# Patient Record
Sex: Female | Born: 1985 | Race: Black or African American | Hispanic: Yes | Marital: Single | Smoking: Never smoker
Health system: Southern US, Community
[De-identification: ages and names within clinical notes are randomized; demographics above are authoritative.]

## PROBLEM LIST (undated history)

## (undated) DIAGNOSIS — D649 Anemia, unspecified: Secondary | ICD-10-CM

## (undated) DIAGNOSIS — K5190 Ulcerative colitis, unspecified, without complications: Secondary | ICD-10-CM

## (undated) DIAGNOSIS — R7303 Prediabetes: Secondary | ICD-10-CM

## (undated) HISTORY — DX: Ulcerative colitis, unspecified, without complications: K51.90

## (undated) HISTORY — DX: Prediabetes: R73.03

## (undated) HISTORY — PX: OTHER SURGICAL HISTORY: SHX169

## (undated) HISTORY — PX: COLONOSCOPY: SHX174

## (undated) HISTORY — PX: NO PAST SURGERIES: SHX2092

---
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
## 2007-02-27 ENCOUNTER — Inpatient Hospital Stay (HOSPITAL_COMMUNITY): Admission: AD | Admit: 2007-02-27 | Discharge: 2007-02-27 | Payer: Self-pay | Admitting: Obstetrics & Gynecology

## 2007-03-18 ENCOUNTER — Inpatient Hospital Stay (HOSPITAL_COMMUNITY): Admission: AD | Admit: 2007-03-18 | Discharge: 2007-03-18 | Payer: Self-pay | Admitting: Family Medicine

## 2007-03-22 ENCOUNTER — Inpatient Hospital Stay (HOSPITAL_COMMUNITY): Admission: AD | Admit: 2007-03-22 | Discharge: 2007-03-22 | Payer: Self-pay | Admitting: Obstetrics & Gynecology

## 2007-04-14 ENCOUNTER — Inpatient Hospital Stay (HOSPITAL_COMMUNITY): Admission: AD | Admit: 2007-04-14 | Discharge: 2007-04-15 | Payer: Self-pay | Admitting: Obstetrics & Gynecology

## 2007-05-03 ENCOUNTER — Inpatient Hospital Stay (HOSPITAL_COMMUNITY): Admission: AD | Admit: 2007-05-03 | Discharge: 2007-05-03 | Payer: Self-pay | Admitting: Obstetrics & Gynecology

## 2007-06-23 ENCOUNTER — Emergency Department (HOSPITAL_COMMUNITY): Admission: EM | Admit: 2007-06-23 | Discharge: 2007-06-23 | Payer: Self-pay | Admitting: Family Medicine

## 2007-07-10 ENCOUNTER — Inpatient Hospital Stay (HOSPITAL_COMMUNITY): Admission: AD | Admit: 2007-07-10 | Discharge: 2007-07-10 | Payer: Self-pay | Admitting: Obstetrics and Gynecology

## 2007-09-20 ENCOUNTER — Inpatient Hospital Stay (HOSPITAL_COMMUNITY): Admission: AD | Admit: 2007-09-20 | Discharge: 2007-09-20 | Payer: Self-pay | Admitting: Family Medicine

## 2007-09-21 ENCOUNTER — Inpatient Hospital Stay (HOSPITAL_COMMUNITY): Admission: AD | Admit: 2007-09-21 | Discharge: 2007-09-23 | Payer: Self-pay | Admitting: Obstetrics and Gynecology

## 2007-09-21 ENCOUNTER — Encounter (INDEPENDENT_AMBULATORY_CARE_PROVIDER_SITE_OTHER): Payer: Self-pay

## 2007-11-30 ENCOUNTER — Emergency Department (HOSPITAL_COMMUNITY): Admission: EM | Admit: 2007-11-30 | Discharge: 2007-11-30 | Payer: Self-pay | Admitting: Emergency Medicine

## 2008-12-09 ENCOUNTER — Emergency Department (HOSPITAL_COMMUNITY): Admission: EM | Admit: 2008-12-09 | Discharge: 2008-12-09 | Payer: Self-pay | Admitting: Family Medicine

## 2009-05-21 ENCOUNTER — Emergency Department (HOSPITAL_COMMUNITY): Admission: EM | Admit: 2009-05-21 | Discharge: 2009-05-21 | Payer: Self-pay | Admitting: Family Medicine

## 2009-07-19 ENCOUNTER — Inpatient Hospital Stay (HOSPITAL_COMMUNITY): Admission: AD | Admit: 2009-07-19 | Discharge: 2009-07-19 | Payer: Self-pay | Admitting: Obstetrics & Gynecology

## 2009-07-19 ENCOUNTER — Ambulatory Visit: Payer: Self-pay | Admitting: Nurse Practitioner

## 2010-01-27 ENCOUNTER — Emergency Department (HOSPITAL_COMMUNITY)
Admission: EM | Admit: 2010-01-27 | Discharge: 2010-01-27 | Payer: Self-pay | Source: Home / Self Care | Admitting: Family Medicine

## 2010-04-18 LAB — URINE CULTURE
Colony Count: NO GROWTH
Culture: NO GROWTH

## 2010-04-18 LAB — CBC
HCT: 42.2 % (ref 36.0–46.0)
Hemoglobin: 14.2 g/dL (ref 12.0–15.0)
MCHC: 33.7 g/dL (ref 30.0–36.0)
MCV: 79.4 fL (ref 78.0–100.0)
Platelets: 354 10*3/uL (ref 150–400)
RBC: 5.31 MIL/uL — ABNORMAL HIGH (ref 3.87–5.11)
RDW: 14.5 % (ref 11.5–15.5)
WBC: 11.6 10*3/uL — ABNORMAL HIGH (ref 4.0–10.5)

## 2010-04-18 LAB — GC/CHLAMYDIA PROBE AMP, GENITAL
Chlamydia, DNA Probe: NEGATIVE
GC Probe Amp, Genital: NEGATIVE

## 2010-04-18 LAB — URINALYSIS, ROUTINE W REFLEX MICROSCOPIC
Bilirubin Urine: NEGATIVE
Glucose, UA: NEGATIVE mg/dL
Ketones, ur: NEGATIVE mg/dL
Leukocytes, UA: NEGATIVE
Nitrite: NEGATIVE
Protein, ur: NEGATIVE mg/dL
Specific Gravity, Urine: >1.03 — ABNORMAL HIGH (ref 1.005–1.030)
Urobilinogen, UA: 2 mg/dL — ABNORMAL HIGH (ref 0.0–1.0)
pH: 6 (ref 5.0–8.0)

## 2010-04-18 LAB — URINE MICROSCOPIC-ADD ON

## 2010-04-18 LAB — WET PREP, GENITAL
Trich, Wet Prep: NONE SEEN
Yeast Wet Prep HPF POC: NONE SEEN

## 2010-04-18 LAB — POCT PREGNANCY, URINE: Preg Test, Ur: NEGATIVE

## 2010-06-15 NOTE — H&P (Signed)
[name], [name]                ACCOUNT NO.:  [id number]   MEDICAL RECORD NO.:  [id number]          PATIENT TYPE:  INP   LOCATION:  9171                          FACILITY:  [facility]   PHYSICIAN:  Amanda DennisDATE OF BIRTH:  [birth year]   DATE OF ADMISSION:  09/21/2007  DATE OF DISCHARGE:  09/20/2007                              HISTORY & PHYSICAL   This is a 25 year old gravida 3, para 0-0-2-0 at 38-5/7 weeks who  presents with complaints of worsening contractions.  She was sent home  this morning with an unchanged cervix that was closed.  She reports  positive fetal movement and denies leaking or bleeding.  Pregnancy has  been followed by the nurse midwife.  Service remarkable for,  1. Late care.  2. Unsure LMP.  3. Anemia.  4. HSV II.  5. Group B strep negative.   ALLERGIES:  Negative.   OB history is remarkable for induced abortion in 2005 and a spontaneous  abortion in 2008 with a D&C.   Medical history is remarkable for,  1. Chlamydia in 2007.  2. Childhood varicella.  3. History of asthma, which requires no current treatment.  4. History of anemia.   Surgical history is remarkable for EAB and D&C.   Family history is remarkable for father with hypertension.  Grandmother  and grandfather with diabetes.  Grandmother with Alzheimer's and father  with depression.   Genetic history is negative.   SOCIAL HISTORY:  The patient is single.  Father of baby, [name],  who is involved and supportive.  She does not report a religious  affiliation.  She denies any alcohol, tobacco, or drug use.   PRENATAL LABORATORY:  Hemoglobin 10.3 and platelets 360.  Blood type A  positive.  Antibody screen negative.  Sickle cell negative.  RPR  nonreactive.  Rubella immune.  Hepatitis negative.  HIV negative.  Gonorrhea negative.  Chlamydia negative.   HISTORY OF CURRENT PREGNANCY:  The patient entered care at 30 weeks'  gestation.  She had an ultrasound that was normal.   She was treated for  BV at 21 weeks and was started on Ensure and iron for nutritional  deficits.  She had glucola that was normal, and she was diagnosed with  herpes type II at 31 weeks when she developed a lesion, and she was  started on Valtrex at 34 weeks and her group B strep was negative as  were gonorrhea and chlamydia cultures at term.   OBJECTIVE:  VITAL SIGNS:  Stable and afebrile.  HEENT:  Within normal limits.  THYROID:  Normal and not enlarged.  CHEST:  Clear to auscultation.  HEART:  Regular rate and rhythm.  ABDOMEN:  Gravid.  Vertex, Leopold exam shows reactive fetal heart rate  with contractions every 1-1/2 to 3 minutes.  CERVIX:  This morning was closed in 80% and is now 2 cm, a 100% effaced  in 0 station with a vertex presentation.  There are no HSV lesions  notable.  EXTREMITIES:  Within normal limits.   ASSESSMENT:  1. Intrauterine pregnancy at 38-5/7 weeks.  2. Labor.   PLAN:  1. Admit to birthing suites per Dr. Pennie Rushing.  2. Routine CNM orders.  3. The patient desires epidural for analgesia.      [name], C.N.M.      ______________________________  [name], M.D.    MLW/MEDQ  D:  09/21/2007  T:  09/21/2007  Job:  [id number]

## 2010-06-15 NOTE — Consult Note (Signed)
NAMEMarland Kitchen  [name], [name]                ACCOUNT NO.:  [id number]   MEDICAL RECORD NO.:  [id number]          PATIENT TYPE:  WOC   LOCATION:  WOC                          FACILITY:  [facility]   PHYSICIAN:  Lesly Dukes, M.D. DATE OF BIRTH:  1985-06-24   DATE OF CONSULTATION:  06/09/2006  DATE OF DISCHARGE:                                 CONSULTATION   HISTORY:  This is a 25 year old, G2, P0-0-1-0, who had a first trimester  elective AB in the past who presented to [facility]U on May 3 due to having  lower abdominal and pelvic pain. She was confirmed to be [redacted] weeks  pregnant by ultrasound which showed an intrauterine embryo with no fetal  heart rate which was consistent with fetal demise. She has had no  further abdominal pain. Denies cramping or any bleeding or any passage  per vagina. She has no other complaints.   ALLERGIES:  None.   MEDICATIONS:  None.   MENSTRUAL HISTORY:  11 x 28 x 3. She had used Depo in the past.   OBSTETRICAL HISTORY:  As above.   GYNECOLOGICAL HISTORY:  Negative Paps in the past. No STIs.   SURGERIES:  None.   FAMILY HISTORY:  Significant for diabetes in a grandparent.   PERSONAL MEDICAL HISTORY:  Negative.   SOCIAL HISTORY:  She works. Nonsmoker. No alcohol or drugs. Denies  abuse.   SYSTEMIC REVIEW:  Essentially negative except for some slight vaginal  itching.   PHYSICAL EXAMINATION:  Blood pressure 107/65, pulse 85, weight 59.9,  height 4 feet 11 inches.  THYROID:  MP.  LUNGS:  Clear to auscultation bilaterally.  HEART:  RRR without murmur done by [employer] student.  EXTREMITIES:  No edema. Reflexes normal.  ABDOMEN:  Soft, nontender. Fundus slightly above the symphysis. Bimanual  exam confirms a long closed cervix and about 8-week size enlarged  nontender uterus.   LABORATORY DATA:  The patient is A positive. Her hemoglobin is 13 on the  3rd. She had a negative urinalysis on the 3rd as well as a negative wet  prep.   ASSESSMENT:  Nine-week missed  abortion.   PLAN:  Consulted Dr. Okey Dupre who is setting up a D&C which is explained to  patient which will be scheduled as soon as possible.      Deirdre Christy Gentles, C.N.M.    ______________________________  Lesly Dukes, M.D.    DP/MEDQ  D:  06/09/2006  T:  06/09/2006  Job:  [id number]

## 2010-06-15 NOTE — Op Note (Signed)
[name], [name]                ACCOUNT NO.:  [id number]   MEDICAL RECORD NO.:  [id number]          PATIENT TYPE:  AMB   LOCATION:  SDC                           [facility]ILITY:  [facility]   PHYSICIAN:  Phil D. Okey Dupre, M.D.     DATE OF BIRTH:  1985/07/15   DATE OF PROCEDURE:  06/12/2006  DATE OF DISCHARGE:                               OPERATIVE REPORT   PROCEDURE:  Dilatation and evacuation.   PREOPERATIVE DIAGNOSIS:  Missed abortion.   POSTOPERATIVE DIAGNOSIS:  Missed abortion.   SURGEON:  Dr. Okey Dupre.   ESTIMATED BLOOD LOSS:  Minimal.   PRODUCTS OF CONCEPTION:  To pathology.   POSTOPERATIVE CONDITION:  Satisfactory.   ANESTHESIA:  Was MAC plus local.   PROCEDURE WENT AS FOLLOWS:  Under satisfactory MAC analgesia with the  patient in dorsal lithotomy position, perineum and vagina prepped in the  usual sterile manner.  Bimanual pelvic examination revealed the uterus  and first degree retroversion freely movable.  Adnexa could not be well  outlined.  A Graves open speculum was placed the vagina so the cervix  could be seen in the center of the viewing area.  Anterior lip of the  cervix grasped with a single-tooth tenaculum.  Paracervical block was  used, using 10 mL of 1% Xylocaine in each of the lateral cervical areas  at 4 and 8 o'clock.  The uterine cavity was then sounded up to a depth  of 9 cm.  Cervical os dilated to #8 Hagar dilator and  a #8 curved  suction curette was used to evacuate the uterine contents without  incident.  Tenaculum and speculum removed from vagina.  The area was  observed for bleeding, and none was noted.  The patient transferred to  the recovery room in satisfactory condition, having tolerated the  procedure well.           ______________________________  Javier Glazier. Okey Dupre, M.D.     PDR/MEDQ  D:  06/12/2006  T:  06/12/2006  Job:  [id number]

## 2010-10-21 LAB — URINALYSIS, ROUTINE W REFLEX MICROSCOPIC
Glucose, UA: NEGATIVE
Ketones, ur: >80 — AB
Leukocytes, UA: NEGATIVE
Nitrite: NEGATIVE
Protein, ur: 30 — AB
Specific Gravity, Urine: >1.03 — ABNORMAL HIGH
Urobilinogen, UA: 1
pH: 6

## 2010-10-21 LAB — POCT PREGNANCY, URINE
Operator id: 280671
Preg Test, Ur: POSITIVE

## 2010-10-21 LAB — URINE MICROSCOPIC-ADD ON

## 2010-10-21 LAB — CBC
HCT: 37.6
Hemoglobin: 12.9
MCHC: 34.4
MCV: 78.6
Platelets: 336
RBC: 4.78
RDW: 13.6
WBC: 10.6 — ABNORMAL HIGH

## 2010-10-21 LAB — WET PREP, GENITAL
Trich, Wet Prep: NONE SEEN
Yeast Wet Prep HPF POC: NONE SEEN

## 2010-10-21 LAB — GC/CHLAMYDIA PROBE AMP, GENITAL
Chlamydia, DNA Probe: NEGATIVE
GC Probe Amp, Genital: NEGATIVE

## 2010-10-22 LAB — COMPREHENSIVE METABOLIC PANEL
ALT: 10
AST: 15
Albumin: 2.2 — ABNORMAL LOW
Alkaline Phosphatase: 47
BUN: 4 — ABNORMAL LOW
CO2: 23
Calcium: 8.5
Chloride: 104
Creatinine, Ser: 0.61
GFR calc Af Amer: >60
GFR calc non Af Amer: >60
Glucose, Bld: 85
Potassium: 3.5
Sodium: 133 — ABNORMAL LOW
Total Bilirubin: 0.3
Total Protein: 5.7 — ABNORMAL LOW

## 2010-10-22 LAB — URINE MICROSCOPIC-ADD ON

## 2010-10-22 LAB — URINALYSIS, ROUTINE W REFLEX MICROSCOPIC
Glucose, UA: NEGATIVE
Ketones, ur: 15 — AB
Leukocytes, UA: NEGATIVE
Nitrite: NEGATIVE
Protein, ur: NEGATIVE
Specific Gravity, Urine: >1.03 — ABNORMAL HIGH
Urobilinogen, UA: 0.2
pH: 6

## 2010-10-22 LAB — WET PREP, GENITAL
Trich, Wet Prep: NONE SEEN
Yeast Wet Prep HPF POC: NONE SEEN

## 2010-10-22 LAB — CBC
HCT: 32.4 — ABNORMAL LOW
Hemoglobin: 11.3 — ABNORMAL LOW
MCHC: 34.8
MCV: 78.9
Platelets: 296
RBC: 4.1
RDW: 14.4
WBC: 6.7

## 2010-10-25 LAB — URINALYSIS, ROUTINE W REFLEX MICROSCOPIC
Glucose, UA: NEGATIVE
Hgb urine dipstick: NEGATIVE
Ketones, ur: 40 — AB
Nitrite: NEGATIVE
Protein, ur: NEGATIVE
Specific Gravity, Urine: >1.03 — ABNORMAL HIGH
Urobilinogen, UA: 0.2
pH: 6

## 2010-10-25 LAB — CBC
HCT: 32.5 — ABNORMAL LOW
Hemoglobin: 11.2 — ABNORMAL LOW
MCHC: 34.5
MCV: 78.7
Platelets: 351
RBC: 4.13
RDW: 13.9
WBC: 11.7 — ABNORMAL HIGH

## 2010-10-25 LAB — COMPREHENSIVE METABOLIC PANEL
ALT: 8
AST: 14
Albumin: 2.1 — ABNORMAL LOW
Alkaline Phosphatase: 61
BUN: 4 — ABNORMAL LOW
CO2: 24
Calcium: 8.4
Chloride: 105
Creatinine, Ser: 0.37 — ABNORMAL LOW
GFR calc Af Amer: >60
GFR calc non Af Amer: >60
Glucose, Bld: 92
Potassium: 3.4 — ABNORMAL LOW
Sodium: 134 — ABNORMAL LOW
Total Bilirubin: 0.5
Total Protein: 6.4

## 2010-10-26 LAB — WET PREP, GENITAL
Clue Cells Wet Prep HPF POC: NONE SEEN
Trich, Wet Prep: NONE SEEN

## 2010-10-26 LAB — URINALYSIS, ROUTINE W REFLEX MICROSCOPIC
Bilirubin Urine: NEGATIVE
Glucose, UA: NEGATIVE
Hgb urine dipstick: NEGATIVE
Ketones, ur: NEGATIVE
Nitrite: NEGATIVE
Protein, ur: NEGATIVE
Specific Gravity, Urine: 1.01
Urobilinogen, UA: 0.2
pH: 6.5

## 2010-10-26 LAB — URINE CULTURE: Colony Count: 35000

## 2010-10-26 LAB — GC/CHLAMYDIA PROBE AMP, GENITAL
Chlamydia, DNA Probe: NEGATIVE
GC Probe Amp, Genital: NEGATIVE

## 2010-10-26 LAB — URINE MICROSCOPIC-ADD ON

## 2011-03-21 ENCOUNTER — Emergency Department (HOSPITAL_COMMUNITY)
Admission: EM | Admit: 2011-03-21 | Discharge: 2011-03-21 | Disposition: A | Discharge status: Home / Self Care | Payer: Self-pay | Source: Home / Self Care

## 2011-07-11 ENCOUNTER — Encounter (HOSPITAL_COMMUNITY): Payer: Self-pay

## 2011-07-11 ENCOUNTER — Emergency Department (INDEPENDENT_AMBULATORY_CARE_PROVIDER_SITE_OTHER)
Admission: EM | Admit: 2011-07-11 | Discharge: 2011-07-11 | Disposition: A | Discharge status: Home / Self Care | Payer: Self-pay | Source: Home / Self Care | Attending: Emergency Medicine | Admitting: Emergency Medicine

## 2011-07-11 DIAGNOSIS — J020 Streptococcal pharyngitis: Secondary | ICD-10-CM

## 2011-07-11 LAB — POCT RAPID STREP A: Streptococcus, Group A Screen (Direct): POSITIVE — AB

## 2011-07-11 MED ORDER — PENICILLIN V POTASSIUM 500 MG PO TABS: 500.0000 mg | ORAL_TABLET | Freq: Four times a day (QID) | ORAL | Status: AC

## 2011-07-11 MED ORDER — HYDROCODONE-ACETAMINOPHEN 5-325 MG PO TABS: 2.0000 | ORAL_TABLET | ORAL | Status: AC | PRN

## 2011-07-11 MED ORDER — ACETAMINOPHEN 500 MG PO TABS
1000.0000 mg | ORAL_TABLET | Freq: Once | ORAL | Status: AC
  Administered 2011-07-11: 1000 mg via ORAL

## 2011-07-11 MED ORDER — ACETAMINOPHEN 325 MG PO TABS
ORAL_TABLET | ORAL | Status: AC
  Filled 2011-07-11: qty 3

## 2011-07-11 NOTE — ED Provider Notes (Signed)
History     CSN: [id number]  Arrival date & time 07/11/11  1258   None     Chief Complaint  Patient presents with  . Sore Throat    (Consider location/radiation/quality/duration/timing/severity/associated sxs/prior treatment) Patient is a 26 y.o. female presenting with pharyngitis. The history is provided by the patient. No language interpreter was used.  Sore Throat This is a new problem. The current episode started more than 2 days ago. The problem occurs constantly. The problem has been gradually worsening. The symptoms are aggravated by nothing. The symptoms are relieved by nothing. She has tried nothing for the symptoms.  Pt complains of a sorethroat and fever.  History reviewed. No pertinent past medical history.  History reviewed. No pertinent past surgical history.  History reviewed. No pertinent family history.  History  Substance Use Topics  . Smoking status: Not on file  . Smokeless tobacco: Not on file  . Alcohol Use: Not on file    OB History    Grav Para Term Preterm Abortions TAB SAB Ect Mult Living                  Review of Systems  HENT: Positive for sore throat.   All other systems reviewed and are negative.    Allergies  Review of patient's allergies indicates no known allergies.  Home Medications  No current outpatient prescriptions on file.  BP 116/63  Pulse 126  Temp(Src) 102.5 F (39.2 C) (Oral)  Resp 20  SpO2 100%  Physical Exam  Nursing note and vitals reviewed. Constitutional: She is oriented to person, place, and time. She appears well-developed and well-nourished.  HENT:  Head: Normocephalic and atraumatic.  Right Ear: External ear normal.  Left Ear: External ear normal.  Nose: Nose normal.  Mouth/Throat: Oropharyngeal exudate present.  Eyes: Conjunctivae and EOM are normal. Pupils are equal, round, and reactive to light.  Neck: Normal range of motion. Neck supple.  Cardiovascular: Normal rate, regular rhythm and normal  heart sounds.   Pulmonary/Chest: Effort normal and breath sounds normal.  Abdominal: Soft.  Musculoskeletal: Normal range of motion.  Neurological: She is alert and oriented to person, place, and time. She has normal reflexes.  Skin: Skin is warm.  Psychiatric: She has a normal mood and affect.    ED Course  Procedures (including critical care time)  Labs Reviewed  POCT RAPID STREP A ([facility] ONLY) - Abnormal; Notable for the following:    Streptococcus, Group A Screen (Direct) POSITIVE (*)    All other components within normal limits   No results found.   1. Strep pharyngitis       MDM  Strep positive,  rx for pcn hydrocodone for pain        Lonia Skinner [place], [place] 07/11/11 1504  Lonia Skinner [place], [place] 07/11/11 1635  Lonia Skinner [place], [place] 07/11/11 1635  Lonia Skinner [place], [place] 07/11/11 1637

## 2011-07-11 NOTE — ED Notes (Signed)
C/o ST for past few days, body aches, fever; posterior nasopharynx reddened, tonsils swollen,exudative

## 2011-07-11 NOTE — Discharge Instructions (Signed)

## 2011-07-12 NOTE — ED Provider Notes (Signed)
Medical screening examination/treatment/procedure(s) were performed by non-physician practitioner and as supervising physician I was immediately available for consultation/collaboration.  Leslee Home, M.D.   Reuben Likes, MD 07/12/11 1059

## 2012-08-13 ENCOUNTER — Emergency Department (INDEPENDENT_AMBULATORY_CARE_PROVIDER_SITE_OTHER)
Admission: EM | Admit: 2012-08-13 | Discharge: 2012-08-13 | Disposition: A | Discharge status: Home / Self Care | Payer: Medicaid Other | Source: Home / Self Care

## 2012-08-13 DIAGNOSIS — Z2089 Contact with and (suspected) exposure to other communicable diseases: Secondary | ICD-10-CM

## 2012-08-13 DIAGNOSIS — Z202 Contact with and (suspected) exposure to infections with a predominantly sexual mode of transmission: Secondary | ICD-10-CM

## 2012-08-13 LAB — POCT URINALYSIS DIP (DEVICE)
Bilirubin Urine: NEGATIVE
Glucose, UA: NEGATIVE mg/dL
Ketones, ur: NEGATIVE mg/dL
Leukocytes, UA: NEGATIVE
Nitrite: NEGATIVE
Protein, ur: NEGATIVE mg/dL
Specific Gravity, Urine: >=1.03 (ref 1.005–1.030)
Urobilinogen, UA: >=8 mg/dL (ref 0.0–1.0)
pH: 6.5 (ref 5.0–8.0)

## 2012-08-13 LAB — POCT PREGNANCY, URINE: Preg Test, Ur: NEGATIVE

## 2012-08-13 NOTE — ED Notes (Signed)
Patient states her partner was told he has chylamdia.   Patient came here to be check.

## 2012-08-13 NOTE — ED Provider Notes (Addendum)
[name] is a 27 y.o. female who presents to Urgent Care today for STD screening. Patient's partner was recently diagnosed with Chlamydia. She's here to be tested. She is currently asymptomatic with vaginal discharge abdominal or pelvic pain or dysuria. She feels well otherwise with no fevers or chills. She has never had a sexually transmitted infections.    PMH reviewed. Healthy otherwise History  Substance Use Topics  . Smoking status: Not on file  . Smokeless tobacco: Not on file  . Alcohol Use: Not on file   ROS as above Medications reviewed. No current facility-administered medications for this encounter.   No current outpatient prescriptions on file.    Exam:  BP 119/75  Pulse 82  Temp(Src) 98.2 F (36.8 C) (Oral)  Resp 16  SpO2 98% Gen: Well NAD Left arm: Implanon contraception device in place  Results for orders placed during the hospital encounter of 08/13/12 (from the past 24 hour(s))  POCT URINALYSIS DIP (DEVICE)     Status: Abnormal   Collection Time    08/13/12  6:10 PM      Result Value Range   Glucose, UA NEGATIVE  NEGATIVE mg/dL   Bilirubin Urine NEGATIVE  NEGATIVE   Ketones, ur NEGATIVE  NEGATIVE mg/dL   Specific Gravity, Urine >=1.030  1.005 - 1.030   Hgb urine dipstick SMALL (*) NEGATIVE   pH 6.5  5.0 - 8.0   Protein, ur NEGATIVE  NEGATIVE mg/dL   Urobilinogen, UA >=1.6  0.0 - 1.0 mg/dL   Nitrite NEGATIVE  NEGATIVE   Leukocytes, UA NEGATIVE  NEGATIVE  POCT PREGNANCY, URINE     Status: None   Collection Time    08/13/12  6:17 PM      Result Value Range   Preg Test, Ur NEGATIVE  NEGATIVE   No results found.  Assessment and Plan: 27 y.o. female with STD exposure.  Plan: Urine collection for Chlamydia and gonorrhea. HIV antibody and RPR.  Will call patient with result.  Phone #[phone]    Rodolph Bong, MD 08/13/12 1819  Rodolph Bong, MD 08/13/12 [phone]

## 2012-08-14 LAB — HIV ANTIBODY (ROUTINE TESTING W REFLEX): HIV: NONREACTIVE

## 2012-08-14 LAB — RPR: RPR Ser Ql: NONREACTIVE

## 2012-08-15 NOTE — ED Notes (Signed)
Chart review.

## 2012-08-17 ENCOUNTER — Emergency Department (INDEPENDENT_AMBULATORY_CARE_PROVIDER_SITE_OTHER)
Admission: EM | Admit: 2012-08-17 | Discharge: 2012-08-17 | Disposition: A | Discharge status: Home / Self Care | Payer: Medicaid Other | Source: Home / Self Care

## 2012-08-17 ENCOUNTER — Encounter (HOSPITAL_COMMUNITY): Payer: Self-pay | Admitting: *Deleted

## 2012-08-17 ENCOUNTER — Other Ambulatory Visit (HOSPITAL_COMMUNITY)
Admission: RE | Admit: 2012-08-17 | Discharge: 2012-08-17 | Disposition: A | Discharge status: Home / Self Care | Payer: Medicaid Other | Source: Ambulatory Visit | Attending: Family Medicine | Admitting: Family Medicine

## 2012-08-17 DIAGNOSIS — Z202 Contact with and (suspected) exposure to infections with a predominantly sexual mode of transmission: Secondary | ICD-10-CM

## 2012-08-17 DIAGNOSIS — N760 Acute vaginitis: Secondary | ICD-10-CM | POA: Insufficient documentation

## 2012-08-17 DIAGNOSIS — Z113 Encounter for screening for infections with a predominantly sexual mode of transmission: Secondary | ICD-10-CM | POA: Insufficient documentation

## 2012-08-17 NOTE — ED Notes (Signed)
Urine sample obtained and pt. told again that Dr. Denyse Amass or myself would call her the results. Pt. voiced understanding.

## 2012-08-17 NOTE — ED Notes (Addendum)
Pt. here to get lab result. Pt. given HIV and RPR results. Pt. said she came to be tested for GC/Chlamydia. I told her that, they were not ordered.  Dr. Denyse Amass talked to pt. and explained what happened. He asked her to get a urine sample and he would call her the results on Sunday.  Pt. agreed to this plan. VS deferred for this visit- lab only.

## 2012-08-17 NOTE — ED Notes (Signed)
Pt. came to [facility] for her lab results. Pt. verified x 2 and told that HIV and RPR were non-reactive. Pt. said she came for the GC/Chlamydia.  The tests were not ordered by mistake. Discussed with Dr. Denyse Amass. He talked to pt. asked her if she can give [place] a urine sample now and we can send it to be checked. He said he will call her Sunday or I will call her on Monday with results. He told her it would be a no charge.  Pt. agreed with the plan.

## 2012-08-20 NOTE — ED Notes (Signed)
Patient called to get her lab reports that are not yet ready. After verifying ID, discussed no lab reports at this time

## 2012-09-19 ENCOUNTER — Encounter (HOSPITAL_COMMUNITY): Payer: Self-pay | Admitting: *Deleted

## 2012-09-19 ENCOUNTER — Inpatient Hospital Stay (HOSPITAL_COMMUNITY)
Admission: AD | Admit: 2012-09-19 | Discharge: 2012-09-19 | Disposition: A | Discharge status: Home / Self Care | Payer: Medicaid Other | Source: Ambulatory Visit | Attending: Obstetrics & Gynecology | Admitting: Obstetrics & Gynecology

## 2012-09-19 DIAGNOSIS — M549 Dorsalgia, unspecified: Secondary | ICD-10-CM

## 2012-09-19 DIAGNOSIS — R197 Diarrhea, unspecified: Secondary | ICD-10-CM

## 2012-09-19 DIAGNOSIS — T148XXA Other injury of unspecified body region, initial encounter: Secondary | ICD-10-CM

## 2012-09-19 DIAGNOSIS — R52 Pain, unspecified: Secondary | ICD-10-CM

## 2012-09-19 HISTORY — DX: Anemia, unspecified: D64.9

## 2012-09-19 LAB — URINALYSIS, ROUTINE W REFLEX MICROSCOPIC
Bilirubin Urine: NEGATIVE
Glucose, UA: NEGATIVE mg/dL
Ketones, ur: NEGATIVE mg/dL
Leukocytes, UA: NEGATIVE
Nitrite: NEGATIVE
Protein, ur: NEGATIVE mg/dL
Specific Gravity, Urine: >1.03 — ABNORMAL HIGH (ref 1.005–1.030)
Urobilinogen, UA: 1 mg/dL (ref 0.0–1.0)
pH: 7 (ref 5.0–8.0)

## 2012-09-19 LAB — URINE MICROSCOPIC-ADD ON

## 2012-09-19 LAB — WET PREP, GENITAL
Trich, Wet Prep: NONE SEEN
WBC, Wet Prep HPF POC: NONE SEEN
Yeast Wet Prep HPF POC: NONE SEEN

## 2012-09-19 LAB — POCT PREGNANCY, URINE: Preg Test, Ur: NEGATIVE

## 2012-09-19 MED ORDER — KETOROLAC TROMETHAMINE 60 MG/2ML IM SOLN
60.0000 mg | INTRAMUSCULAR | Status: AC
  Administered 2012-09-19: 60 mg via INTRAMUSCULAR
  Filled 2012-09-19: qty 2

## 2012-09-19 MED ORDER — NAPROXEN SODIUM 550 MG PO TABS: 550.0000 mg | ORAL_TABLET | Freq: Two times a day (BID) | ORAL | Status: DC

## 2012-09-19 MED ORDER — TIZANIDINE HCL 4 MG PO CAPS: 4.0000 mg | ORAL_CAPSULE | Freq: Three times a day (TID) | ORAL | Status: DC

## 2012-09-19 NOTE — MAU Provider Note (Signed)
History     CSN: [id number]  Arrival date and time: 09/19/12 1430   None    Chief Complaint: "My back is killing me"   HPI Comments: [name] is a 27 y.o. [phone] who presents today with worsening back pain and diarrhea.  Her back pain has been occuring for years, but is has recently become worse.  She can not define the onset, or any traumatic event related to its origin, though she feels that standing all day at [employer] and then working eight hour shifts may make it worse.  Her pain is lumbar, bilateral, and an 8/10.  The pain is worse with movement and better with rest.  She denies numbness, tingling, and bladder or bowel dysfunction.  Her diarrhea began two days ago and is associated with some nausea.  She reports that it may be a "stomach bug."  She has no primary care physician.  Her significant other tested positive for chlamydia approximately two months ago.  She sought testing at [facility], but no results could be found regarding her GC/Chlamydia status.       Past Medical History  Diagnosis Date  . Anemia     Past Surgical History  Procedure Laterality Date  . Dilation and curetage    . No past surgeries      Family History  Problem Relation Age of Onset  . Heart disease Mother   . Hypertension Father   . Diabetes Father   . Diabetes Maternal Grandmother   . Diabetes Paternal Grandfather     History  Substance Use Topics  . Smoking status: Current Every Day Smoker  . Smokeless tobacco: Not on file  . Alcohol Use: Not on file    Allergies: No Known Allergies  No prescriptions prior to admission    Review of Systems  Constitutional: Negative for fever, chills, weight loss and diaphoresis.  HENT: Negative for hearing loss, ear pain and nosebleeds.   Eyes: Negative for blurred vision, double vision and photophobia.  Respiratory: Negative for cough, hemoptysis, shortness of breath and wheezing.   Cardiovascular: Negative for  chest pain and palpitations.  Gastrointestinal: Positive for nausea, vomiting and diarrhea. Negative for heartburn.  Genitourinary: Positive for flank pain. Negative for dysuria, urgency, frequency and hematuria.  Musculoskeletal: Positive for back pain.  Skin: Negative for itching and rash.  Neurological: Negative for dizziness, tingling, tremors, weakness and headaches.  Psychiatric/Behavioral: Negative for depression, suicidal ideas and substance abuse.   Physical Exam   Blood pressure 118/60, pulse 72, temperature 98.7 F (37.1 C), temperature source Oral, resp. rate 16, height 4' 10.5" (1.486 m), weight 66.951 kg (147 lb 9.6 oz), SpO2 98.00%.  Physical Exam  Constitutional: She is oriented to person, place, and time. She appears well-developed and well-nourished. No distress.  HENT:  Head: Normocephalic and atraumatic.  Eyes: Right eye exhibits no discharge. Left eye exhibits no discharge.  Neck: Neck supple. No tracheal deviation present.  Cardiovascular: Normal rate, regular rhythm, S1 normal and S2 normal.  Exam reveals no gallop, no S3, no S4, no distant heart sounds and no friction rub.   No murmur heard. Respiratory: Effort normal and breath sounds normal.  GI: Soft. She exhibits no mass. There is no tenderness.  Genitourinary: Vagina normal and uterus normal. There is no rash, tenderness, lesion or injury on the right labia. There is no rash, tenderness, lesion or injury on the left labia. Cervix exhibits no motion tenderness, no discharge and no  friability. Right adnexum displays no mass, no tenderness and no fullness. Left adnexum displays no mass, no tenderness and no fullness. No erythema, tenderness or bleeding around the vagina. No foreign body around the vagina. No signs of injury around the vagina. No vaginal discharge found.  Musculoskeletal:       Lumbar back: She exhibits decreased range of motion, tenderness and pain. She exhibits no bony tenderness, no swelling, no  edema, no deformity and no laceration.  Neurological: She is alert and oriented to person, place, and time. No cranial nerve deficit. Coordination normal.  Skin: Skin is warm, dry and intact. She is not diaphoretic.  Psychiatric: She has a normal mood and affect. Her speech is normal and behavior is normal. Judgment and thought content normal. Cognition and memory are normal.    MAU Course  Procedures  Results for orders placed during the hospital encounter of 09/19/12 (from the past 24 hour(s))  URINALYSIS, ROUTINE W REFLEX MICROSCOPIC     Status: Abnormal   Collection Time    09/19/12  3:25 PM      Result Value Range   Color, Urine YELLOW  YELLOW   APPearance CLEAR  CLEAR   Specific Gravity, Urine >1.030 (*) 1.005 - 1.030   pH 7.0  5.0 - 8.0   Glucose, UA NEGATIVE  NEGATIVE mg/dL   Hgb urine dipstick TRACE (*) NEGATIVE   Bilirubin Urine NEGATIVE  NEGATIVE   Ketones, ur NEGATIVE  NEGATIVE mg/dL   Protein, ur NEGATIVE  NEGATIVE mg/dL   Urobilinogen, UA 1.0  0.0 - 1.0 mg/dL   Nitrite NEGATIVE  NEGATIVE   Leukocytes, UA NEGATIVE  NEGATIVE  URINE MICROSCOPIC-ADD ON     Status: Abnormal   Collection Time    09/19/12  3:25 PM      Result Value Range   Squamous Epithelial / LPF FEW (*) RARE   WBC, UA 0-2  <3 WBC/hpf   RBC / HPF 3-6  <3 RBC/hpf   Bacteria, UA MANY (*) RARE   Urine-Other MUCOUS PRESENT    POCT PREGNANCY, URINE     Status: None   Collection Time    09/19/12  3:49 PM      Result Value Range   Preg Test, Ur NEGATIVE  NEGATIVE       Assessment and Plan  Back pain: -Toradol 60 mg injection here,  -Anaprox 550mg  bid for home,  -Tizanidine 4 mg at bedtime -Advised to f/u with a primary care physician to follow her for her back pain and refer her to physical therapy. -She was advised to rest a few days before returning to her rigorous work/school schedule.    Unknown GC/Chlamydia status:  -Wet prep, GC and Chlamydia testing performed here.  She will be called  if the results are positive.     CLARK, MICHAEL L 09/19/2012, 7:33 PM

## 2012-09-19 NOTE — MAU Note (Signed)
Patient states she has had generalized body aches for about 2 weeks, diarrhea for 2 days. Had vomiting last week, none now.

## 2012-09-19 NOTE — MAU Note (Signed)
Pt states her back is "killing me"the patient states she felt back pain and body aches and felt like she was going to throw up last week 09/11/2012 Pt states she got rest and felt better.Pt states same thing happened yesterday and she took something, she does't know what was but it didn't help.

## 2012-09-20 LAB — GC/CHLAMYDIA PROBE AMP
CT Probe RNA: NEGATIVE
GC Probe RNA: NEGATIVE

## 2012-10-02 ENCOUNTER — Encounter (HOSPITAL_COMMUNITY): Payer: Self-pay | Admitting: Emergency Medicine

## 2012-10-02 ENCOUNTER — Emergency Department (HOSPITAL_COMMUNITY)
Admission: EM | Admit: 2012-10-02 | Discharge: 2012-10-02 | Disposition: A | Discharge status: Home / Self Care | Payer: Medicaid Other | Source: Home / Self Care

## 2012-10-02 DIAGNOSIS — M7550 Bursitis of unspecified shoulder: Secondary | ICD-10-CM

## 2012-10-02 DIAGNOSIS — M7150 Other bursitis, not elsewhere classified, unspecified site: Secondary | ICD-10-CM

## 2012-10-02 DIAGNOSIS — M549 Dorsalgia, unspecified: Secondary | ICD-10-CM

## 2012-10-02 LAB — POCT URINALYSIS DIP (DEVICE)
Bilirubin Urine: NEGATIVE
Glucose, UA: NEGATIVE mg/dL
Ketones, ur: NEGATIVE mg/dL
Leukocytes, UA: NEGATIVE
Nitrite: NEGATIVE
Protein, ur: NEGATIVE mg/dL
Specific Gravity, Urine: >=1.03 (ref 1.005–1.030)
Urobilinogen, UA: 2 mg/dL — ABNORMAL HIGH (ref 0.0–1.0)
pH: 7 (ref 5.0–8.0)

## 2012-10-02 LAB — POCT PREGNANCY, URINE: Preg Test, Ur: NEGATIVE

## 2012-10-02 MED ORDER — NAPROXEN 500 MG PO TABS: 500.0000 mg | ORAL_TABLET | Freq: Two times a day (BID) | ORAL | Status: DC

## 2012-10-02 MED ORDER — TRAMADOL HCL 50 MG PO TABS: 50.0000 mg | ORAL_TABLET | Freq: Four times a day (QID) | ORAL | Status: DC | PRN

## 2012-10-02 NOTE — ED Provider Notes (Signed)
Medical screening examination/treatment/procedure(s) were performed by a resident physician or non-physician practitioner and as the supervising physician I was immediately available for consultation/collaboration.  Clementeen Graham, MD   Rodolph Bong, MD 10/02/12 2008

## 2012-10-02 NOTE — ED Notes (Signed)
C/o upper mid back pain x 2 wks . [facility]. Pt recently stopped taking because it was not helping. Pt denies injury and urinary symptoms.

## 2012-10-02 NOTE — ED Provider Notes (Signed)
CSN: [id number]     Arrival date & time 10/02/12  1700 History   First MD Initiated Contact with Patient 10/02/12 1803     Chief Complaint  Patient presents with  . Back Pain    x 2wks   (Consider location/radiation/quality/duration/timing/severity/associated sxs/prior Treatment) HPI Comments: 75f presents c/o back pain, upper back, bilaterally.  She was seen at [facility] 2 weeks ago and prescribed tizanidine and naproxen.  She took the tizanidine but did not get the naproxen.  She stopped the tizanidine after a few days bc it was not helping.  The back pain is worse now bc she is on her period.  She has some lower abdominal cramping bc of her menstrual cycle right now.  She denies any other systemic symptoms.  The pain is relieved by a heating pad.  No fever, chills, SOB, CP.     Past Medical History  Diagnosis Date  . Anemia    Past Surgical History  Procedure Laterality Date  . Dilation and curetage    . No past surgeries     Family History  Problem Relation Age of Onset  . Heart disease Mother   . Hypertension Father   . Diabetes Father   . Diabetes Maternal Grandmother   . Diabetes Paternal Grandfather    History  Substance Use Topics  . Smoking status: Current Every Day Smoker  . Smokeless tobacco: Not on file  . Alcohol Use: Yes   OB History   Grav Para Term Preterm Abortions TAB SAB Ect Mult Living   3 1 1  0 2 1 1  0 0 1     Review of Systems  Constitutional: Negative for fever and chills.  Eyes: Negative for visual disturbance.  Respiratory: Negative for cough and shortness of breath.   Cardiovascular: Negative for chest pain, palpitations and leg swelling.  Gastrointestinal: Negative for nausea, vomiting and abdominal pain.  Endocrine: Negative for polydipsia and polyuria.  Genitourinary: Negative for dysuria, urgency and frequency.  Musculoskeletal: Positive for back pain. Negative for myalgias and arthralgias.  Skin: Negative for rash.  Neurological: Negative  for dizziness, weakness and light-headedness.    Allergies  Review of patient's allergies indicates no known allergies.  Home Medications   Current Outpatient Rx  Name  Route  Sig  Dispense  Refill  . naproxen (NAPROSYN) 500 MG tablet   Oral   Take 1 tablet (500 mg total) by mouth 2 (two) times daily.   60 tablet   0   . tiZANidine (ZANAFLEX) 4 MG capsule   Oral   Take 1 capsule (4 mg total) by mouth 3 (three) times daily.   40 capsule   0   . traMADol (ULTRAM) 50 MG tablet   Oral   Take 1 tablet (50 mg total) by mouth every 6 (six) hours as needed for pain.   30 tablet   0   . trolamine salicylate (ASPERCREME) 10 % cream   Topical   Apply 1 application topically as needed (back pain).          BP 104/63  Pulse 78  Temp(Src) 98.4 F (36.9 C) (Oral)  Resp 16  SpO2 100% Physical Exam  Nursing note and vitals reviewed. Constitutional: She is oriented to person, place, and time. Vital signs are normal. She appears well-developed and well-nourished. No distress.  HENT:  Head: Normocephalic and atraumatic.  Eyes: EOM are normal.  Pulmonary/Chest: She exhibits tenderness (right rib cage).  Musculoskeletal:  Thoracic back: She exhibits normal range of motion, no tenderness, no bony tenderness and no deformity.  Neurological: She is alert and oriented to person, place, and time. She has normal strength.  Skin: Skin is warm and dry. She is not diaphoretic.  Psychiatric: She has a normal mood and affect. Her behavior is normal. Judgment normal.    ED Course  Procedures (including critical care time) Labs Review Labs Reviewed  POCT URINALYSIS DIP (DEVICE) - Abnormal; Notable for the following:    Hgb urine dipstick TRACE (*)    Urobilinogen, UA 2.0 (*)    All other components within normal limits  POCT PREGNANCY, URINE   Imaging Review No results found.  MDM   1. Back pain   2. Subscapular bursitis    Pain did not get better, probably bc she did not  take the prescribed medications.  Will Rx naprosyn and tramadol.  F/u PRN   Meds ordered this encounter  Medications  . naproxen (NAPROSYN) 500 MG tablet    Sig: Take 1 tablet (500 mg total) by mouth 2 (two) times daily.    Dispense:  60 tablet    Refill:  0  . traMADol (ULTRAM) 50 MG tablet    Sig: Take 1 tablet (50 mg total) by mouth every 6 (six) hours as needed for pain.    Dispense:  30 tablet    Refill:  0     Graylon Good, PA-C 10/02/12 1830

## 2013-12-02 ENCOUNTER — Encounter (HOSPITAL_COMMUNITY): Payer: Self-pay | Admitting: Emergency Medicine

## 2014-10-21 ENCOUNTER — Emergency Department (INDEPENDENT_AMBULATORY_CARE_PROVIDER_SITE_OTHER)
Admission: EM | Admit: 2014-10-21 | Discharge: 2014-10-21 | Disposition: A | Discharge status: Home / Self Care | Payer: Self-pay | Source: Home / Self Care | Attending: Emergency Medicine | Admitting: Emergency Medicine

## 2014-10-21 ENCOUNTER — Other Ambulatory Visit (HOSPITAL_COMMUNITY)
Admission: RE | Admit: 2014-10-21 | Discharge: 2014-10-21 | Disposition: A | Discharge status: Home / Self Care | Payer: Medicaid Other | Source: Ambulatory Visit | Attending: Emergency Medicine | Admitting: Emergency Medicine

## 2014-10-21 ENCOUNTER — Encounter (HOSPITAL_COMMUNITY): Payer: Self-pay | Admitting: Emergency Medicine

## 2014-10-21 DIAGNOSIS — Z202 Contact with and (suspected) exposure to infections with a predominantly sexual mode of transmission: Secondary | ICD-10-CM

## 2014-10-21 DIAGNOSIS — R0982 Postnasal drip: Secondary | ICD-10-CM

## 2014-10-21 DIAGNOSIS — N898 Other specified noninflammatory disorders of vagina: Secondary | ICD-10-CM

## 2014-10-21 DIAGNOSIS — J301 Allergic rhinitis due to pollen: Secondary | ICD-10-CM

## 2014-10-21 DIAGNOSIS — N760 Acute vaginitis: Secondary | ICD-10-CM | POA: Insufficient documentation

## 2014-10-21 DIAGNOSIS — Z113 Encounter for screening for infections with a predominantly sexual mode of transmission: Secondary | ICD-10-CM | POA: Insufficient documentation

## 2014-10-21 LAB — POCT URINALYSIS DIP (DEVICE)
Bilirubin Urine: NEGATIVE
Glucose, UA: NEGATIVE mg/dL
Ketones, ur: NEGATIVE mg/dL
Leukocytes, UA: NEGATIVE
Nitrite: NEGATIVE
Protein, ur: 30 mg/dL — AB
Specific Gravity, Urine: >=1.03 (ref 1.005–1.030)
Urobilinogen, UA: 0.2 mg/dL (ref 0.0–1.0)
pH: 6.5 (ref 5.0–8.0)

## 2014-10-21 LAB — POCT PREGNANCY, URINE: Preg Test, Ur: NEGATIVE

## 2014-10-21 MED ORDER — METRONIDAZOLE 500 MG PO TABS: 500.0000 mg | ORAL_TABLET | Freq: Two times a day (BID) | ORAL | Status: DC

## 2014-10-21 NOTE — Discharge Instructions (Signed)
Allergic Rhinitis Allegra or Claritin daily For congestion Sudafed PE 10 mg  Saline nasal spray Allergic rhinitis is when the mucous membranes in the nose respond to allergens. Allergens are particles in the air that cause your body to have an allergic reaction. This causes you to release allergic antibodies. Through a chain of events, these eventually cause you to release histamine into the blood stream. Although meant to protect the body, it is this release of histamine that causes your discomfort, such as frequent sneezing, congestion, and an itchy, runny nose.  CAUSES  Seasonal allergic rhinitis (hay fever) is caused by pollen allergens that may come from grasses, trees, and weeds. Year-round allergic rhinitis (perennial allergic rhinitis) is caused by allergens such as house dust mites, pet dander, and mold spores.  SYMPTOMS   Nasal stuffiness (congestion).  Itchy, runny nose with sneezing and tearing of the eyes. DIAGNOSIS  Your health care provider can help you determine the allergen or allergens that trigger your symptoms. If you and your health care provider are unable to determine the allergen, skin or blood testing may be used. TREATMENT  Allergic rhinitis does not have a cure, but it can be controlled by:  Medicines and allergy shots (immunotherapy).  Avoiding the allergen. Hay fever may often be treated with antihistamines in pill or nasal spray forms. Antihistamines block the effects of histamine. There are over-the-counter medicines that may help with nasal congestion and swelling around the eyes. Check with your health care provider before taking or giving this medicine.  If avoiding the allergen or the medicine prescribed do not work, there are many new medicines your health care provider can prescribe. Stronger medicine may be used if initial measures are ineffective. Desensitizing injections can be used if medicine and avoidance does not work. Desensitization is when a patient  is given ongoing shots until the body becomes less sensitive to the allergen. Make sure you follow up with your health care provider if problems continue. HOME CARE INSTRUCTIONS It is not possible to completely avoid allergens, but you can reduce your symptoms by taking steps to limit your exposure to them. It helps to know exactly what you are allergic to so that you can avoid your specific triggers. SEEK MEDICAL CARE IF:   You have a fever.  You develop a cough that does not stop easily (persistent).  You have shortness of breath.  You start wheezing.  Symptoms interfere with normal daily activities. Document Released: 10/12/2000 Document Revised: 01/22/2013 Document Reviewed: 09/24/2012 [facility] Patient Information 2015 [place], [place]. This information is not intended to replace advice given to you by your health care provider. Make sure you discuss any questions you have with your health care provider.  Sexually Transmitted Disease A sexually transmitted disease (STD) is a disease or infection that may be passed (transmitted) from person to person, usually during sexual activity. This may happen by way of saliva, semen, blood, vaginal mucus, or urine. Common STDs include:   Gonorrhea.   Chlamydia.   Syphilis.   HIV and AIDS.   Genital herpes.   Hepatitis B and C.   Trichomonas.   Human papillomavirus (HPV).   Pubic lice.   Scabies.  Mites.  Bacterial vaginosis. WHAT ARE CAUSES OF STDs? An STD may be caused by bacteria, a virus, or parasites. STDs are often transmitted during sexual activity if one person is infected. However, they may also be transmitted through nonsexual means. STDs may be transmitted after:   Sexual intercourse with an  infected person.   Sharing sex toys with an infected person.   Sharing needles with an infected person or using unclean piercing or tattoo needles.  Having intimate contact with the genitals, mouth, or rectal areas  of an infected person.   Exposure to infected fluids during birth. WHAT ARE THE SIGNS AND SYMPTOMS OF STDs? Different STDs have different symptoms. Some people may not have any symptoms. If symptoms are present, they may include:   Painful or bloody urination.   Pain in the pelvis, abdomen, vagina, anus, throat, or eyes.   A skin rash, itching, or irritation.  Growths, ulcerations, blisters, or sores in the genital and anal areas.  Abnormal vaginal discharge with or without bad odor.   Penile discharge in men.   Fever.   Pain or bleeding during sexual intercourse.   Swollen glands in the groin area.   Yellow skin and eyes (jaundice). This is seen with hepatitis.   Swollen testicles.  Infertility.  Sores and blisters in the mouth. HOW ARE STDs DIAGNOSED? To make a diagnosis, your health care provider may:   Take a medical history.   Perform a physical exam.   Take a sample of any discharge to examine.  Swab the throat, cervix, opening to the penis, rectum, or vagina for testing.  Test a sample of your first morning urine.   Perform blood tests.   Perform a Pap test, if this applies.   Perform a colposcopy.   Perform a laparoscopy.  HOW ARE STDs TREATED? Treatment depends on the STD. Some STDs may be treated but not cured.   Chlamydia, gonorrhea, trichomonas, and syphilis can be cured with antibiotic medicine.   Genital herpes, hepatitis, and HIV can be treated, but not cured, with prescribed medicines. The medicines lessen symptoms.   Genital warts from HPV can be treated with medicine or by freezing, burning (electrocautery), or surgery. Warts may come back.   HPV cannot be cured with medicine or surgery. However, abnormal areas may be removed from the cervix, vagina, or vulva.   If your diagnosis is confirmed, your recent sexual partners need treatment. This is true even if they are symptom-free or have a negative culture or  evaluation. They should not have sex until their health care providers say it is okay. HOW CAN I REDUCE MY RISK OF GETTING AN STD? Take these steps to reduce your risk of getting an STD:  Use latex condoms, dental dams, and water-soluble lubricants during sexual activity. Do not use petroleum jelly or oils.  Avoid having multiple sex partners.  Do not have sex with someone who has other sex partners.  Do not have sex with anyone you do not know or who is at high risk for an STD.  Avoid risky sex practices that can break your skin.  Do not have sex if you have open sores on your mouth or skin.  Avoid drinking too much alcohol or taking illegal drugs. Alcohol and drugs can affect your judgment and put you in a vulnerable position.  Avoid engaging in oral and anal sex acts.  Get vaccinated for HPV and hepatitis. If you have not received these vaccines in the past, talk to your health care provider about whether one or both might be right for you.   If you are at risk of being infected with HIV, it is recommended that you take a prescription medicine daily to prevent HIV infection. This is called pre-exposure prophylaxis (PrEP). You are considered at risk  if:  You are a man who has sex with other men (MSM).  You are a heterosexual man or woman and are sexually active with more than one partner.  You take drugs by injection.  You are sexually active with a partner who has HIV.  Talk with your health care provider about whether you are at high risk of being infected with HIV. If you choose to begin PrEP, you should first be tested for HIV. You should then be tested every 3 months for as long as you are taking PrEP.  WHAT SHOULD I DO IF I THINK I HAVE AN STD?  See your health care provider.   Tell your sexual partner(s). They should be tested and treated for any STDs.  Do not have sex until your health care provider says it is okay. WHEN SHOULD I GET IMMEDIATE MEDICAL  CARE? Contact your health care provider right away if:   You have severe abdominal pain.  You are a man and notice swelling or pain in your testicles.  You are a woman and notice swelling or pain in your vagina. Document Released: 04/09/2002 Document Revised: 01/22/2013 Document Reviewed: 08/07/2012 [facility] Patient Information 2015 [place], [place]. This information is not intended to replace advice given to you by your health care provider. Make sure you discuss any questions you have with your health care provider.  Safe Sex Safe sex is about reducing the risk of giving or getting a sexually transmitted disease (STD). STDs are spread through sexual contact involving the genitals, mouth, or rectum. Some STDs can be cured and others cannot. Safe sex can also prevent unintended pregnancies.  WHAT ARE SOME SAFE SEX PRACTICES?  Limit your sexual activity to only one partner who is having sex with only you.  Talk to your partner about his or her past partners, past STDs, and drug use.  Use a condom every time you have sexual intercourse. This includes vaginal, oral, and anal sexual activity. Both females and males should wear condoms during oral sex. Only use latex or polyurethane condoms and water-based lubricants. Using petroleum-based lubricants or oils to lubricate a condom will weaken the condom and increase the chance that it will break. The condom should be in place from the beginning to the end of sexual activity. Wearing a condom reduces, but does not completely eliminate, your risk of getting or giving an STD. STDs can be spread by contact with infected body fluids and skin.  Get vaccinated for hepatitis B and HPV.  Avoid alcohol and recreational drugs, which can affect your judgment. You may forget to use a condom or participate in high-risk sex.  For females, avoid douching after sexual intercourse. Douching can spread an infection farther into the reproductive tract.  Check your  body for signs of sores, blisters, rashes, or unusual discharge. See your health care provider if you notice any of these signs.  Avoid sexual contact if you have symptoms of an infection or are being treated for an STD. If you or your partner has herpes, avoid sexual contact when blisters are present. Use condoms at all other times.  If you are at risk of being infected with HIV, it is recommended that you take a prescription medicine daily to prevent HIV infection. This is called pre-exposure prophylaxis (PrEP). You are considered at risk if:  You are a man who has sex with other men (MSM).  You are a heterosexual man or woman who is sexually active with more than one  partner.  You take drugs by injection.  You are sexually active with a partner who has HIV.  Talk with your health care provider about whether you are at high risk of being infected with HIV. If you choose to begin PrEP, you should first be tested for HIV. You should then be tested every 3 months for as long as you are taking PrEP.  See your health care provider for regular screenings, exams, and tests for other STDs. Before having sex with a new partner, each of you should be screened for STDs and should talk about the results with each other. WHAT ARE THE BENEFITS OF SAFE SEX?   There is less chance of getting or giving an STD.  You can prevent unwanted or unintended pregnancies.  By discussing safe sex concerns with your partner, you may increase feelings of intimacy, comfort, trust, and honesty between the two of you. Document Released: 02/25/2004 Document Revised: 06/03/2013 Document Reviewed: 07/11/2011 [facility] Patient Information 2015 [place], [place]. This information is not intended to replace advice given to you by your health care provider. Make sure you discuss any questions you have with your health care provider.

## 2014-10-21 NOTE — ED Provider Notes (Addendum)
CSN: [id number]     Arrival date & time 10/21/14  1301 History   First MD Initiated Contact with Patient 10/21/14 1326     Chief Complaint  Patient presents with  . Exposure to STD   (Consider location/radiation/quality/duration/timing/severity/associated sxs/prior Treatment) HPI Comments: 29 year old female complaining of a mild sore throat for 2 days and stating that she feels a little sickly. Her primary reason for her visit today is that she was notified 2 days ago that she may be exposed to STD. Denies any symptoms. Denies pelvic pain or vaginal discharge. She states her LMP was 10/04/2014.  Patient is a 29 y.o. female presenting with STD exposure. The history is provided by the patient.  Exposure to STD This is a recurrent problem. Pertinent negatives include no chest pain, no abdominal pain, no headaches and no shortness of breath. Nothing aggravates the symptoms. Nothing relieves the symptoms.    Past Medical History  Diagnosis Date  . Anemia    Past Surgical History  Procedure Laterality Date  . Dilation and curetage    . No past surgeries     Family History  Problem Relation Age of Onset  . Heart disease Mother   . Hypertension Father   . Diabetes Father   . Diabetes Maternal Grandmother   . Diabetes Paternal Grandfather    Social History  Substance Use Topics  . Smoking status: Current Every Day Smoker  . Smokeless tobacco: None  . Alcohol Use: Yes   OB History    Gravida Para Term Preterm AB TAB SAB Ectopic Multiple Living   0 0 0 1     Review of Systems  Constitutional: Negative.   HENT: Positive for sore throat and voice change. Negative for congestion, ear pain, postnasal drip and rhinorrhea.   Eyes: Negative.   Respiratory: Negative for cough, choking, shortness of breath and wheezing.   Cardiovascular: Negative for chest pain.  Gastrointestinal: Negative.  Negative for abdominal pain.  Genitourinary: Negative for dysuria, urgency,  frequency, hematuria, flank pain, decreased urine volume, vaginal bleeding, vaginal discharge, vaginal pain, menstrual problem and pelvic pain.  Musculoskeletal: Negative.   Neurological: Negative for headaches.    Allergies  Review of patient's allergies indicates no known allergies.  Home Medications   Prior to Admission medications   Medication Sig Start Date End Date Taking? Authorizing Provider  metroNIDAZOLE (FLAGYL) 500 MG tablet Take 1 tablet (500 mg total) by mouth 2 (two) times daily. X 7 days 10/21/14   Hayden Rasmussen, NP  naproxen (NAPROSYN) 500 MG tablet Take 1 tablet (500 mg total) by mouth 2 (two) times daily. 10/02/12   Adrian Blackwater Baker, PA-C  tiZANidine (ZANAFLEX) 4 MG capsule Take 1 capsule (4 mg total) by mouth 3 (three) times daily. 09/19/12   Adam Phenix, MD  traMADol (ULTRAM) 50 MG tablet Take 1 tablet (50 mg total) by mouth every 6 (six) hours as needed for pain. 10/02/12   Graylon Good, PA-C  trolamine salicylate (ASPERCREME) 10 % cream Apply 1 application topically as needed (back pain).    Historical Provider, MD   Meds Ordered and Administered this Visit  Medications - No data to display  BP 100/58 mmHg  Pulse 64  Temp(Src) 98.2 F (36.8 C) (Oral)  Resp 16  SpO2 100% No data found.   Physical Exam  Constitutional: She is oriented to person, place, and time. She appears well-developed and well-nourished. No distress.  HENT:  Head:  Normocephalic and atraumatic.  Mouth/Throat: No oropharyngeal exudate.  Oropharynx with minor erythema and cobblestoning. Scant clear PND. No exudates, no deep erythema or swelling.  Eyes: EOM are normal.  Neck: Normal range of motion. Neck supple.  Cardiovascular: Normal rate and normal heart sounds.   Pulmonary/Chest: Effort normal. No respiratory distress. She has no wheezes.  Abdominal: Soft. There is no tenderness.  Genitourinary:  Normal external female genitalia Small amount of thin white discharge coating the vaginal  walls Cervix posterior far left of midline. There is a pool of thin white discharge in the vaginal vault. Cervix pink and without lesions. No bleeding. No CMT.  Musculoskeletal: She exhibits no edema.  Lymphadenopathy:    She has no cervical adenopathy.  Neurological: She is alert and oriented to person, place, and time. She exhibits normal muscle tone.  Skin: Skin is warm and dry.  Psychiatric: She has a normal mood and affect.  Nursing note and vitals reviewed.   ED Course  Procedures (including critical care time)  Labs Review Labs Reviewed  POCT URINALYSIS DIP (DEVICE) - Abnormal; Notable for the following:    Hgb urine dipstick TRACE (*)    Protein, ur 30 (*)    All other components within normal limits  POCT PREGNANCY, URINE    Imaging Review No results found.   Visual Acuity Review  Right Eye Distance:   Left Eye Distance:   Bilateral Distance:    Right Eye Near:   Left Eye Near:    Bilateral Near:         MDM   1. Allergic rhinitis due to pollen   2. PND (post-nasal drip)   3. Exposure to sexually transmitted disease (STD)   4. Vaginal discharge    Flagyl bid Cytolgoy pending Allegra or Claritin daily For congestion Sudafed PE 10 mg  Saline nasal spray     Hayden Rasmussen, NP 10/21/14 2056 Rocephin 250 mg IM. Called to return for tx   Hayden Rasmussen, NP 10/23/14 1524

## 2014-10-21 NOTE — ED Notes (Signed)
Patient reports in general she does not feel well, non-specific, admits to nausea.  Patient reports a partner has gonorrhea.  Patient denies any abdominal pain, no back pain, no vaginal discharge.  Patient is not using any birth control

## 2014-10-22 LAB — CERVICOVAGINAL ANCILLARY ONLY
Chlamydia: NEGATIVE
Neisseria Gonorrhea: POSITIVE — AB

## 2014-10-23 LAB — CERVICOVAGINAL ANCILLARY ONLY: Wet Prep (BD Affirm): POSITIVE — AB

## 2014-10-23 MED ORDER — CEFTRIAXONE SODIUM 250 MG IJ SOLR: 250.0000 mg | Freq: Once | INTRAMUSCULAR | Status: DC

## 2014-10-23 NOTE — ED Notes (Signed)
Final report of vaginal testing available for review . Positive for GC, no treatment while in the [facility]. Left message for patient to call us to discuss lab reports will need Rocephin 250 mg IM. Discussed w Sherren Mocha, NP

## 2014-10-23 NOTE — ED Notes (Signed)
Patient received rocephin injection in right upper quadrant.  Reconstituted with lidocaine 1%.  Rocephin 250 mg im x 1 verbal order Hayden Rasmussen, np/klh,rn

## 2014-10-24 NOTE — ED Notes (Signed)
Final report of STD screening positive for GC. Patient advised , returned to [facility] for IM Rocephin 9-22. Form 2124 [employer]HS completed and faxed to [facility] for their records. Patient partner had already informed patient that he has  GC, presumed treated as needed

## 2017-01-29 ENCOUNTER — Encounter (HOSPITAL_COMMUNITY): Payer: Self-pay

## 2017-01-29 ENCOUNTER — Ambulatory Visit (HOSPITAL_COMMUNITY)
Admission: EM | Admit: 2017-01-29 | Discharge: 2017-01-29 | Disposition: A | Discharge status: Home / Self Care | Payer: Self-pay | Attending: Internal Medicine | Admitting: Internal Medicine

## 2017-01-29 DIAGNOSIS — K0889 Other specified disorders of teeth and supporting structures: Secondary | ICD-10-CM

## 2017-01-29 DIAGNOSIS — R220 Localized swelling, mass and lump, head: Secondary | ICD-10-CM

## 2017-01-29 MED ORDER — NAPROXEN 500 MG PO TABS
500.0000 mg | ORAL_TABLET | Freq: Two times a day (BID) | ORAL | 0 refills | Status: AC
Start: 2017-01-29 — End: 2017-02-08

## 2017-01-29 MED ORDER — CETIRIZINE HCL 10 MG PO TABS: 10.0000 mg | ORAL_TABLET | Freq: Every day | ORAL | 0 refills | Status: DC

## 2017-01-29 MED ORDER — AMOXICILLIN-POT CLAVULANATE 875-125 MG PO TABS: 1.0000 | ORAL_TABLET | Freq: Two times a day (BID) | ORAL | 0 refills | Status: DC

## 2017-01-29 NOTE — Discharge Instructions (Signed)
Facial swelling could be due to inflammation from dental pain/infection.  Start Augmentin for possible dental infection. Naproxen pain and inflammation.  Zyrtec for possible allergic reaction causing facial swelling.  Follow up with dentist for further treatment and evaluation. If experiencing swelling of the throat, trouble breathing, trouble swallowing, increased swelling of the face, fever, follow-up at the emergency department for further evaluation.

## 2017-01-29 NOTE — ED Triage Notes (Signed)
Pt reports facial swelling on left side of face since yesterday hasn't taking anything since Advil. Pt reports no new food tried. No itching today but was itching yesterday.

## 2017-01-29 NOTE — ED Provider Notes (Signed)
[facility]    CSN: [id number] Arrival date & time: 01/29/17  1220     History   Chief Complaint Chief Complaint  Patient presents with  . Facial Swelling    HPI [name] is a 31 y.o. female.   31 year old female comes in for 2-day history of left facial swelling.  States she first noticed some left upper tooth/gum pain, and noticed left facial swelling later on in the afternoon yesterday.  She took some Advil that helped with the gum pain.  She woke up this morning with increased swelling of the left face, which is now improved.  Denies any new foods, exposures that could cause allergic reactions.  Denies trouble breathing, trouble swallowing, swelling of the throat, tripoding, drooling.  denies fever, chills, night sweats.  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      Past Medical History:  Diagnosis Date  . Anemia     Patient Active Problem List   Diagnosis Date Noted  . 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 dilation and curetage    . NO PAST SURGERIES      OB History    Gravida Para Term Preterm AB Living   3 1 1  0 2 1   SAB TAB Ectopic Multiple Live Births   1 1 0 0         Home Medications    Prior to Admission medications   Medication Sig Start Date End Date Taking? Authorizing Provider  amoxicillin-clavulanate (AUGMENTIN) 875-125 MG tablet Take 1 tablet by mouth every 12 (twelve) hours. 01/29/17   Cathie HoopsYu, Dayla Gasca V, PA-C  cetirizine (ZYRTEC) 10 MG tablet Take 1 tablet (10 mg total) by mouth daily. 01/29/17   Cathie HoopsYu, Summer Mccolgan V, PA-C  naproxen (NAPROSYN) 500 MG tablet Take 1 tablet (500 mg total) by mouth 2 (two) times daily for 10 days. 01/29/17 02/08/17  Cathie HoopsYu, Sharol Croghan V, PA-C  tiZANidine (ZANAFLEX) 4 MG capsule Take 1 capsule (4 mg total) by mouth 3 (three) times daily. 09/19/12   Adam PhenixArnold, James G, MD  traMADol (ULTRAM) 50 MG tablet  Take 1 tablet (50 mg total) by mouth every 6 (six) hours as needed for pain. 10/02/12   Graylon GoodBaker, Zachary H, PA-C  trolamine salicylate (ASPERCREME) 10 % cream Apply 1 application topically as needed (back pain).    [provider]    Family History Family History  Problem Relation Age of Onset  . Heart disease Mother   . Hypertension Father   . Diabetes Father   . Diabetes Maternal Grandmother   . Diabetes Paternal Grandfather     Social History Social History   Tobacco Use  . Smoking status: Current Every Day Smoker  . Smokeless tobacco: Never Used  Substance Use Topics  . Alcohol use: Yes  . Drug use: No     Allergies   Patient has no known allergies.   Review of Systems Review of Systems  Reason unable to perform ROS: See HPI as above.     Physical Exam Triage Vital Signs ED Triage Vitals  Enc Vitals Group     BP 01/29/17 1316 112/70     Pulse Rate 01/29/17 1316 82     Resp 01/29/17 1316 18     Temp 01/29/17 1316 98.6 F (37 C)     Temp Source 01/29/17 1316 Oral  SpO2 01/29/17 1316 100 %     Weight --      Height --      Head Circumference --      Peak Flow --      Pain Score 01/29/17 1315 8     Pain Loc --      Pain Edu? --      Excl. in GC? --    No data found.  Updated Vital Signs BP 112/70 (BP Location: Left Arm)   Pulse 82   Temp 98.6 F (37 C) (Oral)   Resp 18   LMP 01/13/2017 (Exact Date)   SpO2 100%   Physical Exam  Constitutional: She is oriented to person, place, and time. She appears well-developed and well-nourished. No distress.  HENT:  Head: Normocephalic and atraumatic.  Right Ear: Tympanic membrane, external ear and ear canal normal. Tympanic membrane is not erythematous and not bulging.  Left Ear: Tympanic membrane, external ear and ear canal normal. Tympanic membrane is not erythematous and not bulging.  Nose: Right sinus exhibits no maxillary sinus tenderness and no frontal sinus tenderness. Left sinus exhibits  maxillary sinus tenderness. Left sinus exhibits no frontal sinus tenderness.  Mouth/Throat: Uvula is midline, oropharynx is clear and moist and mucous membranes are normal.    Left upper molar dental carry with broken tooth.  Tenderness to palpation around gum area.  Floor of mouth soft to palpation.  Mild left facial swelling around the maxillary area.  No  erythema, increased warmth.  No crepitus heard during palpation.  Eyes: Conjunctivae are normal. Pupils are equal, round, and reactive to light.  Neck: Normal range of motion. Neck supple.  Cardiovascular: Normal rate, regular rhythm and normal heart sounds. Exam reveals no gallop and no friction rub.  No murmur heard. Pulmonary/Chest: Effort normal and breath sounds normal. She has no decreased breath sounds. She has no wheezes. She has no rhonchi. She has no rales.  Lymphadenopathy:    She has no cervical adenopathy.  Neurological: She is alert and oriented to person, place, and time.  Skin: Skin is warm and dry.  Psychiatric: She has a normal mood and affect. Her behavior is normal. Judgment normal.     UC Treatments / Results  Labs (all labs ordered are listed, but only abnormal results are displayed) Labs Reviewed - No data to display  EKG  EKG Interpretation None       Radiology No results found.  Procedures Procedures (including critical care time)  Medications Ordered in UC Medications - No data to display   Initial Impression / Assessment and Plan / UC Course  I have reviewed the triage vital signs and the nursing notes.  Pertinent labs & imaging results that were available during my care of the patient were reviewed by me and considered in my medical decision making (see chart for details).    Given history and exam, will treat for possible dental infection with Augmentin.  Will start Zyrtec for a possible allergic reaction.  Naproxen for pain and inflammation.  Patient talking in full sentences, without  trouble breathing, shortness of breath, wheezing, drooling, in no acute distress. Discussed with patient symptoms can return if dental problem is not addressed. Follow up with dentist for further evaluation and treatment of dental pain. Resources given.  Patient expresses understanding and agrees to plan.   Final Clinical Impressions(s) / UC Diagnoses   Final diagnoses:  Facial swelling  Pain, dental    ED Discharge Orders  Ordered    naproxen (NAPROSYN) 500 MG tablet  2 times daily     01/29/17 1411    amoxicillin-clavulanate (AUGMENTIN) 875-125 MG tablet  Every 12 hours     01/29/17 1411    cetirizine (ZYRTEC) 10 MG tablet  Daily     01/29/17 1411        Belinda Fisher, PA-C 01/29/17 1419

## 2019-02-07 ENCOUNTER — Ambulatory Visit: Payer: Self-pay | Attending: Internal Medicine

## 2019-12-08 ENCOUNTER — Emergency Department (HOSPITAL_COMMUNITY): Payer: Self-pay

## 2019-12-08 ENCOUNTER — Other Ambulatory Visit: Payer: Self-pay

## 2019-12-08 ENCOUNTER — Inpatient Hospital Stay (HOSPITAL_COMMUNITY)
Admission: EM | Admit: 2019-12-08 | Discharge: 2019-12-13 | DRG: 386 | Disposition: A | Discharge status: Home / Self Care | Payer: Self-pay | Attending: Student in an Organized Health Care Education/Training Program | Admitting: Student in an Organized Health Care Education/Training Program

## 2019-12-08 ENCOUNTER — Encounter (HOSPITAL_COMMUNITY): Payer: Self-pay | Admitting: Emergency Medicine

## 2019-12-08 DIAGNOSIS — E099 Drug or chemical induced diabetes mellitus without complications: Secondary | ICD-10-CM

## 2019-12-08 DIAGNOSIS — R739 Hyperglycemia, unspecified: Secondary | ICD-10-CM | POA: Diagnosis not present

## 2019-12-08 DIAGNOSIS — Z20822 Contact with and (suspected) exposure to covid-19: Secondary | ICD-10-CM | POA: Diagnosis present

## 2019-12-08 DIAGNOSIS — Z833 Family history of diabetes mellitus: Secondary | ICD-10-CM

## 2019-12-08 DIAGNOSIS — K529 Noninfective gastroenteritis and colitis, unspecified: Secondary | ICD-10-CM

## 2019-12-08 DIAGNOSIS — K922 Gastrointestinal hemorrhage, unspecified: Secondary | ICD-10-CM | POA: Diagnosis present

## 2019-12-08 DIAGNOSIS — L708 Other acne: Secondary | ICD-10-CM

## 2019-12-08 DIAGNOSIS — D509 Iron deficiency anemia, unspecified: Secondary | ICD-10-CM | POA: Diagnosis present

## 2019-12-08 DIAGNOSIS — E876 Hypokalemia: Secondary | ICD-10-CM | POA: Diagnosis present

## 2019-12-08 DIAGNOSIS — R197 Diarrhea, unspecified: Secondary | ICD-10-CM

## 2019-12-08 DIAGNOSIS — E871 Hypo-osmolality and hyponatremia: Secondary | ICD-10-CM

## 2019-12-08 DIAGNOSIS — R112 Nausea with vomiting, unspecified: Secondary | ICD-10-CM

## 2019-12-08 DIAGNOSIS — F17200 Nicotine dependence, unspecified, uncomplicated: Secondary | ICD-10-CM | POA: Diagnosis present

## 2019-12-08 DIAGNOSIS — D75839 Thrombocytosis, unspecified: Secondary | ICD-10-CM | POA: Diagnosis present

## 2019-12-08 DIAGNOSIS — E8809 Other disorders of plasma-protein metabolism, not elsewhere classified: Secondary | ICD-10-CM | POA: Diagnosis present

## 2019-12-08 DIAGNOSIS — R1084 Generalized abdominal pain: Secondary | ICD-10-CM

## 2019-12-08 DIAGNOSIS — I959 Hypotension, unspecified: Secondary | ICD-10-CM | POA: Diagnosis present

## 2019-12-08 DIAGNOSIS — E861 Hypovolemia: Secondary | ICD-10-CM | POA: Diagnosis present

## 2019-12-08 DIAGNOSIS — E860 Dehydration: Secondary | ICD-10-CM | POA: Diagnosis present

## 2019-12-08 DIAGNOSIS — K5100 Ulcerative (chronic) pancolitis without complications: Principal | ICD-10-CM

## 2019-12-08 DIAGNOSIS — Z8249 Family history of ischemic heart disease and other diseases of the circulatory system: Secondary | ICD-10-CM

## 2019-12-08 DIAGNOSIS — T380X5A Adverse effect of glucocorticoids and synthetic analogues, initial encounter: Secondary | ICD-10-CM | POA: Diagnosis not present

## 2019-12-08 LAB — URINALYSIS, ROUTINE W REFLEX MICROSCOPIC
Glucose, UA: NEGATIVE mg/dL
Ketones, ur: 20 mg/dL — AB
Leukocytes,Ua: NEGATIVE
Nitrite: NEGATIVE
Protein, ur: 100 mg/dL — AB
Specific Gravity, Urine: 1.026 (ref 1.005–1.030)
pH: 5 (ref 5.0–8.0)

## 2019-12-08 LAB — COMPREHENSIVE METABOLIC PANEL
ALT: 12 U/L (ref 0–44)
ALT: 12 U/L (ref 0–44)
AST: 13 U/L — ABNORMAL LOW (ref 15–41)
AST: 14 U/L — ABNORMAL LOW (ref 15–41)
Albumin: 1.7 g/dL — ABNORMAL LOW (ref 3.5–5.0)
Albumin: 1.4 g/dL — ABNORMAL LOW (ref 3.5–5.0)
Alkaline Phosphatase: 72 U/L (ref 38–126)
Alkaline Phosphatase: 63 U/L (ref 38–126)
Anion gap: 9 (ref 5–15)
Anion gap: 10 (ref 5–15)
BUN: 5 mg/dL — ABNORMAL LOW (ref 6–20)
BUN: 5 mg/dL — ABNORMAL LOW (ref 6–20)
CO2: 27 mmol/L (ref 22–32)
CO2: 23 mmol/L (ref 22–32)
Calcium: 8 mg/dL — ABNORMAL LOW (ref 8.9–10.3)
Calcium: 6.9 mg/dL — ABNORMAL LOW (ref 8.9–10.3)
Chloride: 95 mmol/L — ABNORMAL LOW (ref 98–111)
Chloride: 99 mmol/L (ref 98–111)
Creatinine, Ser: 0.88 mg/dL (ref 0.44–1.00)
Creatinine, Ser: 0.77 mg/dL (ref 0.44–1.00)
GFR, Estimated: >60 mL/min (ref >60)
GFR, Estimated: >60 mL/min (ref >60)
Glucose, Bld: 129 mg/dL — ABNORMAL HIGH (ref 70–99)
Glucose, Bld: 88 mg/dL (ref 70–99)
Potassium: 3.4 mmol/L — ABNORMAL LOW (ref 3.5–5.1)
Potassium: 3.9 mmol/L (ref 3.5–5.1)
Sodium: 131 mmol/L — ABNORMAL LOW (ref 135–145)
Sodium: 132 mmol/L — ABNORMAL LOW (ref 135–145)
Total Bilirubin: 0.6 mg/dL (ref 0.3–1.2)
Total Bilirubin: 0.6 mg/dL (ref 0.3–1.2)
Total Protein: 7.4 g/dL (ref 6.5–8.1)
Total Protein: 6 g/dL — ABNORMAL LOW (ref 6.5–8.1)

## 2019-12-08 LAB — RETICULOCYTES
Immature Retic Fract: 32.9 % — ABNORMAL HIGH (ref 2.3–15.9)
RBC.: 4.07 MIL/uL (ref 3.87–5.11)
Retic Count, Absolute: 57 10*3/uL (ref 19.0–186.0)
Retic Ct Pct: 1.4 % (ref 0.4–3.1)

## 2019-12-08 LAB — CBC
HCT: 34.5 % — ABNORMAL LOW (ref 36.0–46.0)
Hemoglobin: 11.7 g/dL — ABNORMAL LOW (ref 12.0–15.0)
MCH: 24.7 pg — ABNORMAL LOW (ref 26.0–34.0)
MCHC: 33.9 g/dL (ref 30.0–36.0)
MCV: 72.8 fL — ABNORMAL LOW (ref 80.0–100.0)
Platelets: 607 10*3/uL — ABNORMAL HIGH (ref 150–400)
RBC: 4.74 MIL/uL (ref 3.87–5.11)
RDW: 13.4 % (ref 11.5–15.5)
WBC: 19.4 10*3/uL — ABNORMAL HIGH (ref 4.0–10.5)
nRBC: 0 % (ref 0.0–0.2)

## 2019-12-08 LAB — CBC WITH DIFFERENTIAL/PLATELET
Abs Immature Granulocytes: 0 10*3/uL (ref 0.00–0.07)
Basophils Absolute: 0 10*3/uL (ref 0.0–0.1)
Basophils Relative: 0 %
Eosinophils Absolute: 0.7 10*3/uL — ABNORMAL HIGH (ref 0.0–0.5)
Eosinophils Relative: 4 %
HCT: 29.3 % — ABNORMAL LOW (ref 36.0–46.0)
Hemoglobin: 10.1 g/dL — ABNORMAL LOW (ref 12.0–15.0)
Lymphocytes Relative: 21 %
Lymphs Abs: 3.7 10*3/uL (ref 0.7–4.0)
MCH: 24.8 pg — ABNORMAL LOW (ref 26.0–34.0)
MCHC: 34.5 g/dL (ref 30.0–36.0)
MCV: 72 fL — ABNORMAL LOW (ref 80.0–100.0)
Monocytes Absolute: 1.9 10*3/uL — ABNORMAL HIGH (ref 0.1–1.0)
Monocytes Relative: 11 %
Neutro Abs: 11.3 10*3/uL — ABNORMAL HIGH (ref 1.7–7.7)
Neutrophils Relative %: 64 %
Platelets: 469 10*3/uL — ABNORMAL HIGH (ref 150–400)
RBC: 4.07 MIL/uL (ref 3.87–5.11)
RDW: 13.3 % (ref 11.5–15.5)
WBC: 17.6 10*3/uL — ABNORMAL HIGH (ref 4.0–10.5)
nRBC: 0 % (ref 0.0–0.2)
nRBC: 0 /100 WBC

## 2019-12-08 LAB — I-STAT BETA HCG BLOOD, ED (MC, WL, AP ONLY): I-stat hCG, quantitative: 7.1 m[IU]/mL — ABNORMAL HIGH (ref <5)

## 2019-12-08 LAB — RESPIRATORY PANEL BY RT PCR (FLU A&B, COVID)
Influenza A by PCR: NEGATIVE
Influenza B by PCR: NEGATIVE
SARS Coronavirus 2 by RT PCR: NEGATIVE

## 2019-12-08 LAB — LIPASE, BLOOD: Lipase: 20 U/L (ref 11–51)

## 2019-12-08 LAB — LACTIC ACID, PLASMA
Lactic Acid, Venous: 1.3 mmol/L (ref 0.5–1.9)
Lactic Acid, Venous: 1 mmol/L (ref 0.5–1.9)

## 2019-12-08 LAB — MAGNESIUM: Magnesium: 1.8 mg/dL (ref 1.7–2.4)

## 2019-12-08 LAB — PROTIME-INR
INR: 1.2 (ref 0.8–1.2)
Prothrombin Time: 14.5 seconds (ref 11.4–15.2)

## 2019-12-08 LAB — PREGNANCY, URINE: Preg Test, Ur: NEGATIVE

## 2019-12-08 LAB — PHOSPHORUS: Phosphorus: 3 mg/dL (ref 2.5–4.6)

## 2019-12-08 LAB — HCG, SERUM, QUALITATIVE: Preg, Serum: NEGATIVE

## 2019-12-08 LAB — CK: Total CK: 83 U/L (ref 38–234)

## 2019-12-08 MED ORDER — SODIUM CHLORIDE 0.9 % IV BOLUS
1000.0000 mL | Freq: Once | INTRAVENOUS | Status: AC
  Administered 2019-12-08: 1000 mL via INTRAVENOUS

## 2019-12-08 MED ORDER — IOHEXOL 300 MG/ML  SOLN
100.0000 mL | Freq: Once | INTRAMUSCULAR | Status: AC | PRN
  Administered 2019-12-08: 100 mL via INTRAVENOUS

## 2019-12-08 MED ORDER — PIPERACILLIN-TAZOBACTAM 3.375 G IVPB 30 MIN
3.3750 g | Freq: Once | INTRAVENOUS | Status: AC
  Administered 2019-12-08: 3.375 g via INTRAVENOUS
  Filled 2019-12-08: qty 50

## 2019-12-08 MED ORDER — ONDANSETRON HCL 4 MG/2ML IJ SOLN
4.0000 mg | Freq: Four times a day (QID) | INTRAMUSCULAR | Status: DC | PRN
  Administered 2019-12-10: 4 mg via INTRAVENOUS
  Filled 2019-12-08: qty 2

## 2019-12-08 MED ORDER — ONDANSETRON HCL 4 MG PO TABS
4.0000 mg | ORAL_TABLET | Freq: Four times a day (QID) | ORAL | Status: DC | PRN
  Administered 2019-12-09 (×2): 4 mg via ORAL
  Filled 2019-12-08 (×2): qty 1

## 2019-12-08 MED ORDER — LACTATED RINGERS IV SOLN: INTRAVENOUS | Status: AC

## 2019-12-08 MED ORDER — ACETAMINOPHEN 650 MG RE SUPP: 650.0000 mg | Freq: Four times a day (QID) | RECTAL | Status: DC | PRN

## 2019-12-08 MED ORDER — ACETAMINOPHEN 325 MG PO TABS
650.0000 mg | ORAL_TABLET | Freq: Four times a day (QID) | ORAL | Status: DC | PRN
  Administered 2019-12-09 – 2019-12-13 (×3): 650 mg via ORAL
  Filled 2019-12-08 (×3): qty 2

## 2019-12-08 NOTE — ED Triage Notes (Signed)
Pt coming from home. Complaint of abdominal pain and nausea, and back pain for 2 weeks. NAD.

## 2019-12-08 NOTE — ED Notes (Signed)
Patient transported to CT 
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
## 2019-12-08 NOTE — ED Notes (Signed)
Patient transported to X-ray 

## 2019-12-09 DIAGNOSIS — E8809 Other disorders of plasma-protein metabolism, not elsewhere classified: Secondary | ICD-10-CM | POA: Diagnosis present

## 2019-12-09 DIAGNOSIS — E876 Hypokalemia: Secondary | ICD-10-CM

## 2019-12-09 DIAGNOSIS — D509 Iron deficiency anemia, unspecified: Secondary | ICD-10-CM | POA: Diagnosis present

## 2019-12-09 DIAGNOSIS — K5100 Ulcerative (chronic) pancolitis without complications: Principal | ICD-10-CM

## 2019-12-09 HISTORY — DX: Hypokalemia: E87.6

## 2019-12-09 LAB — COMPREHENSIVE METABOLIC PANEL
ALT: 10 U/L (ref 0–44)
AST: 10 U/L — ABNORMAL LOW (ref 15–41)
Albumin: 1.2 g/dL — ABNORMAL LOW (ref 3.5–5.0)
Alkaline Phosphatase: 55 U/L (ref 38–126)
Anion gap: 11 (ref 5–15)
BUN: 5 mg/dL — ABNORMAL LOW (ref 6–20)
CO2: 21 mmol/L — ABNORMAL LOW (ref 22–32)
Calcium: 7.2 mg/dL — ABNORMAL LOW (ref 8.9–10.3)
Chloride: 100 mmol/L (ref 98–111)
Creatinine, Ser: 0.77 mg/dL (ref 0.44–1.00)
GFR, Estimated: >60 mL/min (ref >60)
Glucose, Bld: 77 mg/dL (ref 70–99)
Potassium: 3.3 mmol/L — ABNORMAL LOW (ref 3.5–5.1)
Sodium: 132 mmol/L — ABNORMAL LOW (ref 135–145)
Total Bilirubin: 1 mg/dL (ref 0.3–1.2)
Total Protein: 5.4 g/dL — ABNORMAL LOW (ref 6.5–8.1)

## 2019-12-09 LAB — C-REACTIVE PROTEIN: CRP: 12 mg/dL — ABNORMAL HIGH (ref <1.0)

## 2019-12-09 LAB — PROTEIN / CREATININE RATIO, URINE
Creatinine, Urine: 92.49 mg/dL
Protein Creatinine Ratio: 0.32 mg/mg{Cre} — ABNORMAL HIGH (ref 0.00–0.15)
Total Protein, Urine: 30 mg/dL

## 2019-12-09 LAB — GASTROINTESTINAL PANEL BY PCR, STOOL (REPLACES STOOL CULTURE)

## 2019-12-09 LAB — HCG, QUANTITATIVE, PREGNANCY: hCG, Beta Chain, Quant, S: 1 m[IU]/mL (ref <5)

## 2019-12-09 LAB — HEMOGLOBIN A1C
Hgb A1c MFr Bld: 5.7 % — ABNORMAL HIGH (ref 4.8–5.6)
Mean Plasma Glucose: 116.89 mg/dL

## 2019-12-09 LAB — PROTIME-INR
INR: 1.4 — ABNORMAL HIGH (ref 0.8–1.2)
Prothrombin Time: 16.2 seconds — ABNORMAL HIGH (ref 11.4–15.2)

## 2019-12-09 LAB — HEPATITIS A ANTIBODY, IGM: Hep A IgM: NONREACTIVE

## 2019-12-09 LAB — C DIFFICILE QUICK SCREEN W PCR REFLEX
C Diff antigen: NEGATIVE
C Diff interpretation: NOT DETECTED
C Diff toxin: NEGATIVE

## 2019-12-09 LAB — POC OCCULT BLOOD, ED: Fecal Occult Bld: NEGATIVE

## 2019-12-09 LAB — CBC
HCT: 26.9 % — ABNORMAL LOW (ref 36.0–46.0)
Hemoglobin: 9.1 g/dL — ABNORMAL LOW (ref 12.0–15.0)
MCH: 24.7 pg — ABNORMAL LOW (ref 26.0–34.0)
MCHC: 33.8 g/dL (ref 30.0–36.0)
MCV: 73.1 fL — ABNORMAL LOW (ref 80.0–100.0)
Platelets: 441 10*3/uL — ABNORMAL HIGH (ref 150–400)
RBC: 3.68 MIL/uL — ABNORMAL LOW (ref 3.87–5.11)
RDW: 13.6 % (ref 11.5–15.5)
WBC: 19.3 10*3/uL — ABNORMAL HIGH (ref 4.0–10.5)
nRBC: 0 % (ref 0.0–0.2)

## 2019-12-09 LAB — IRON AND TIBC
Iron: 12 ug/dL — ABNORMAL LOW (ref 28–170)
Saturation Ratios: 9 % — ABNORMAL LOW (ref 10.4–31.8)
TIBC: 133 ug/dL — ABNORMAL LOW (ref 250–450)
UIBC: 121 ug/dL

## 2019-12-09 LAB — WET PREP, GENITAL
Clue Cells Wet Prep HPF POC: NONE SEEN
Sperm: NONE SEEN
Trich, Wet Prep: NONE SEEN
Yeast Wet Prep HPF POC: NONE SEEN

## 2019-12-09 LAB — APTT: aPTT: 23 seconds — ABNORMAL LOW (ref 24–36)

## 2019-12-09 LAB — SEDIMENTATION RATE: Sed Rate: 55 mm/hr — ABNORMAL HIGH (ref 0–22)

## 2019-12-09 LAB — HIV ANTIBODY (ROUTINE TESTING W REFLEX): HIV Screen 4th Generation wRfx: NONREACTIVE

## 2019-12-09 LAB — TSH: TSH: 2.231 u[IU]/mL (ref 0.350–4.500)

## 2019-12-09 LAB — FERRITIN: Ferritin: 99 ng/mL (ref 11–307)

## 2019-12-09 MED ORDER — PROMETHAZINE HCL 25 MG/ML IJ SOLN
12.5000 mg | Freq: Four times a day (QID) | INTRAMUSCULAR | Status: DC | PRN
  Administered 2019-12-09: 12.5 mg via INTRAVENOUS
  Filled 2019-12-09: qty 1

## 2019-12-09 MED ORDER — KETOROLAC TROMETHAMINE 15 MG/ML IJ SOLN
15.0000 mg | Freq: Three times a day (TID) | INTRAMUSCULAR | Status: DC | PRN
  Administered 2019-12-10: 15 mg via INTRAVENOUS
  Filled 2019-12-09: qty 1

## 2019-12-09 MED ORDER — RIVAROXABAN 10 MG PO TABS
10.0000 mg | ORAL_TABLET | Freq: Every day | ORAL | Status: DC
  Administered 2019-12-09 – 2019-12-13 (×4): 10 mg via ORAL
  Filled 2019-12-09 (×5): qty 1

## 2019-12-09 MED ORDER — KCL IN DEXTROSE-NACL 10-5-0.45 MEQ/L-%-% IV SOLN
INTRAVENOUS | Status: DC
  Filled 2019-12-09 (×4): qty 1000

## 2019-12-09 NOTE — ED Notes (Signed)
Walked patient to the bathroom patient did well 

## 2019-12-09 NOTE — ED Notes (Signed)
Walked patient to the bathroom patient did great  

## 2019-12-09 NOTE — Progress Notes (Addendum)
Subjective:  Patient evaluated at bedside this AM. She endorses ongoing left lower quadrant abdominal pain. She reports bloating earlier that was relieved with bowel movements and vomiting. Per patient, over the past two weeks, has had multiple watery bowel movements (>10 days) that can wake her up from sleep as well. Notes a mild rash under her breasts. Notes nausea most of the time that is often relieved with bowel movements. She notes that she has had emesis over past few days. Unclear about weight loss. She has had some relief with Pepcid. No recent travel. She did travel to [place] in June for 4 days. Asymptomatic June to October. History of iron deficiency anemia during pregnancy; does not have heavy periods.   Objective:  Vital signs in last 24 hours: Vitals:   12/09/19 0530 12/09/19 0600 12/09/19 0630 12/09/19 0700  BP: (!) 95/59 97/64 93/60  98/65  Pulse: (!) 104 89 93 95  Resp: (!) 21 17 20 18   Temp:      TempSrc:      SpO2: 97% 99% 99% 100%  Weight:      Height:       Physical Exam: General: Pleasant, no acute distress CV: Tachycardic, regular rhythm. No m/r/g Abdomen: Soft, non-distended. Generalized tenderness, more prominently in epigastric and LLQ regions MSK: No pitting edema.  Skin: No rashes appreciated  Assessment/Plan: [name] is 34yo female with iron deficiency anemia admitted 11/7 with pancolitis concerning for infectious vs inflammatory disease process.  Principal Problem:   Pancolitis (HCC) Active Problems:   Hypoalbuminemia   Hypokalemia   Microcytic anemia  #Pancolitis #Hematochezia Patient presenting with three weeks of abdominal pain, diarrhea, nausea, vomiting, weakness, chills, decreased po intake. Mentions at least 6 loose stool per day, sometimes 10+. She says she is sometimes woken up at night due to diarrhea. No recent travel. Mentions she eats seafood often as she as a 34yo at the 13/7. She has noticed intermittent hematochezia.  No previous episodes or family history GI illnesses. Since arrival, has remained afebrile, tachycardic. Labs notable for hypoalbuminemia, leukocytosis, elevated eosinophils, microcytic anemia, thrombocytosis, elevated sed rate. CT abdomen/pelvis in ED showed diffuse pan-colonic edematous mural thickening with mucosal hyperemia. Given her symptoms and demographics, concern for inflammatory bowel disease. Cannot rule out infectious process, including parasites due to elevated eosinophils. Received one dose Zosyn in ED, will hold further doses pending GI panel. GI consulted, will wait for evaluation for IBD/colonoscopy until GI panel results.  - Pending GI panel - F/u BCx, UCx - Neg C diff - Hep A, FOBT negative - Zofran, phenergan PRN for nausea - GI consulted  #Severe hypoalbuminemia On arrival, albumin significantly low at 1.7. Given clinical picture, at risk for protein-waisting enteropathy. Nephrotic syndrome ruled out with normal UPC. Anticipating this will improve as we treat the pancolitis. - Clear diet as of now, can consider RD consult as able to tolerate po  #Hypoproliferative microcytic anemia Patient reports history of iron deficiency and anemia of pregnancy. On arrival, Hgb 11.7, MCV 72. Reticulocyte index 0.76. Ferritin and iron panel pending.  - Iron studies pending  #Hypovolemic hyponatremia Patient dry on exam, tachycardic. Na 131. Continue to hydrate today with dextrose/1/2NS. Will re-check values in the morning. - IVF today - F/u AM Na  #Hypokalemia K+ 3.4 on arrival, most likely 2/2 GI loss given her many BM/day over last few weeks. Repleting, will re-check again in AM. - F/u AM K  #Prediabetes A1c 5.7 on admission. No previous history  of diabetes or diabetic medications. - Will monitor glucose, especially if steroids are needed for IBD  DIET: Clear IVF: D5 1/2 NS today DVT PPX: Xarelto 10mg  daily BOWEL: Imodium CODE: FULL FAM COM: Will discuss with patient if she  would like to communicate with a family member.  Prior to Admission Living Arrangement: Home Anticipated Discharge Location: Home Barriers to Discharge: medical management Dispo: Anticipated discharge in approximately 3-4 day(s).   [place], MD 12/09/2019, 7:50 AM Pager: [phone] After 5pm on weekdays and 1pm on weekends: On Call pager [phone]

## 2019-12-09 NOTE — ED Notes (Signed)
Hooked patient back up to the monitor patient is resting with call bell in reach 

## 2019-12-10 LAB — COMPREHENSIVE METABOLIC PANEL
ALT: 8 U/L (ref 0–44)
AST: 11 U/L — ABNORMAL LOW (ref 15–41)
Albumin: 1.2 g/dL — ABNORMAL LOW (ref 3.5–5.0)
Alkaline Phosphatase: 60 U/L (ref 38–126)
Anion gap: 8 (ref 5–15)
BUN: <5 mg/dL — ABNORMAL LOW (ref 6–20)
CO2: 25 mmol/L (ref 22–32)
Calcium: 7.3 mg/dL — ABNORMAL LOW (ref 8.9–10.3)
Chloride: 99 mmol/L (ref 98–111)
Creatinine, Ser: 0.62 mg/dL (ref 0.44–1.00)
GFR, Estimated: >60 mL/min (ref >60)
Glucose, Bld: 122 mg/dL — ABNORMAL HIGH (ref 70–99)
Potassium: 3.2 mmol/L — ABNORMAL LOW (ref 3.5–5.1)
Sodium: 132 mmol/L — ABNORMAL LOW (ref 135–145)
Total Bilirubin: 0.6 mg/dL (ref 0.3–1.2)
Total Protein: 5.9 g/dL — ABNORMAL LOW (ref 6.5–8.1)

## 2019-12-10 LAB — CERVICOVAGINAL ANCILLARY ONLY
Bacterial Vaginitis (gardnerella): POSITIVE — AB
Chlamydia: NEGATIVE
Comment: NEGATIVE
Comment: NEGATIVE
Comment: NEGATIVE
Comment: NORMAL
Neisseria Gonorrhea: NEGATIVE
Trichomonas: NEGATIVE

## 2019-12-10 LAB — URINE CULTURE: Culture: <10000 — AB

## 2019-12-10 LAB — CBC WITH DIFFERENTIAL/PLATELET
Abs Immature Granulocytes: 0 10*3/uL (ref 0.00–0.07)
Basophils Absolute: 0 10*3/uL (ref 0.0–0.1)
Basophils Relative: 0 %
Eosinophils Absolute: 0.7 10*3/uL — ABNORMAL HIGH (ref 0.0–0.5)
Eosinophils Relative: 5 %
HCT: 27.9 % — ABNORMAL LOW (ref 36.0–46.0)
Hemoglobin: 9.6 g/dL — ABNORMAL LOW (ref 12.0–15.0)
Lymphocytes Relative: 27 %
Lymphs Abs: 3.7 10*3/uL (ref 0.7–4.0)
MCH: 25 pg — ABNORMAL LOW (ref 26.0–34.0)
MCHC: 34.4 g/dL (ref 30.0–36.0)
MCV: 72.7 fL — ABNORMAL LOW (ref 80.0–100.0)
Monocytes Absolute: 1.4 10*3/uL — ABNORMAL HIGH (ref 0.1–1.0)
Monocytes Relative: 10 %
Neutro Abs: 8 10*3/uL — ABNORMAL HIGH (ref 1.7–7.7)
Neutrophils Relative %: 58 %
Platelets: 532 10*3/uL — ABNORMAL HIGH (ref 150–400)
RBC: 3.84 MIL/uL — ABNORMAL LOW (ref 3.87–5.11)
RDW: 13.5 % (ref 11.5–15.5)
WBC: 13.8 10*3/uL — ABNORMAL HIGH (ref 4.0–10.5)
nRBC: 0 % (ref 0.0–0.2)
nRBC: 0 /100 WBC

## 2019-12-10 LAB — GLUCOSE, CAPILLARY
Glucose-Capillary: 120 mg/dL — ABNORMAL HIGH (ref 70–99)
Glucose-Capillary: 106 mg/dL — ABNORMAL HIGH (ref 70–99)
Glucose-Capillary: 113 mg/dL — ABNORMAL HIGH (ref 70–99)
Glucose-Capillary: 130 mg/dL — ABNORMAL HIGH (ref 70–99)

## 2019-12-10 LAB — MRSA PCR SCREENING: MRSA by PCR: NEGATIVE

## 2019-12-10 MED ORDER — LOPERAMIDE HCL 2 MG PO CAPS: 4.0000 mg | ORAL_CAPSULE | ORAL | Status: DC | PRN

## 2019-12-10 MED ORDER — SODIUM CHLORIDE 0.9 % IV SOLN: INTRAVENOUS | Status: DC

## 2019-12-10 MED ORDER — DICYCLOMINE HCL 10 MG PO CAPS
10.0000 mg | ORAL_CAPSULE | Freq: Three times a day (TID) | ORAL | Status: DC
  Administered 2019-12-10 – 2019-12-13 (×9): 10 mg via ORAL
  Filled 2019-12-10 (×14): qty 1

## 2019-12-10 MED ORDER — KCL IN DEXTROSE-NACL 20-5-0.45 MEQ/L-%-% IV SOLN
INTRAVENOUS | Status: DC
  Filled 2019-12-10 (×4): qty 1000

## 2019-12-10 MED ORDER — POLYETHYLENE GLYCOL 3350 17 GM/SCOOP PO POWD
1.0000 | Freq: Once | ORAL | Status: AC
  Administered 2019-12-10: 255 g via ORAL
  Filled 2019-12-10: qty 255

## 2019-12-10 MED ORDER — SODIUM CHLORIDE 0.9 % IV SOLN
510.0000 mg | INTRAVENOUS | Status: DC
  Administered 2019-12-10: 510 mg via INTRAVENOUS
  Filled 2019-12-10: qty 17

## 2019-12-10 NOTE — TOC Initial Note (Addendum)
Transition of Care [facility]) - Initial/Assessment Note    Patient Details  Name: GWENDLYN HANBACK MRN: [id number] Date of Birth: 11/10/1985  Transition of [facility]) CM/SW Contact:    Lawerance Sabal, RN Phone Number: 12/10/2019, 2:45 PM  Clinical Narrative:                   Expected Discharge Plan: Home/Self Care Barriers to Discharge: Continued Medical Work up   Patient Goals and CMS Choice 11/10 Spoke w patient over the phone. She states that she recently got coverage through "[employer]" and has not received her cards yet. She is going to look up policy if she can.  If unable to verify coverage, she may need assistance with medications at DC. She states she could find her own PCP when CM offered to assist. CM will place [facility] information on AVS, patient is appreciative of resource. CM will follow up tomorrow with patient to see if she could ascertain what her coverage is.   11/11 Spoke w patient again, she has not been able to get information about her potential insurance coverage. TOC will continue to follow for medication assistance.       Expected Discharge Plan and Services Expected Discharge Plan: Home/Self Care                                              Prior Living Arrangements/Services                       Activities of Daily Living Home Assistive Devices/Equipment: None ADL Screening (condition at time of admission) Patient's cognitive ability adequate to safely complete daily activities?: Yes Is the patient deaf or have difficulty hearing?: No Does the patient have difficulty seeing, even when wearing glasses/contacts?: No Does the patient have difficulty concentrating, remembering, or making decisions?: No Patient able to express need for assistance with ADLs?: No Does the patient have difficulty dressing or bathing?: No Independently performs ADLs?: Yes (appropriate for developmental age) Does the patient have difficulty walking or climbing  stairs?: No Weakness of Legs: None Weakness of Arms/Hands: None  Permission Sought/Granted                  Emotional Assessment              Admission diagnosis:  Colitis [K52.9] Generalized abdominal pain [R10.84] Lower GI bleed [K92.2] Hypotension, unspecified hypotension type [I95.9] Diarrhea, unspecified type [R19.7] Nausea and vomiting, intractability of vomiting not specified, unspecified vomiting type [R11.2] Patient Active Problem List   Diagnosis Date Noted  . Hypoalbuminemia 12/09/2019  . Hypokalemia 12/09/2019  . Microcytic anemia 12/09/2019  . Pancolitis (HCC) 12/08/2019  . 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

## 2019-12-10 NOTE — Anesthesia Preprocedure Evaluation (Addendum)
Anesthesia Evaluation  Patient identified by MRN, date of birth, ID band Patient awake    Reviewed: Allergy & Precautions, NPO status , Patient's Chart, lab work & pertinent test results  History of Anesthesia Complications Negative for: history of anesthetic complications  Airway Mallampati: II  TM Distance: >3 FB Neck ROM: Full    Dental no notable dental hx. (+) Dental Advisory Given   Pulmonary Current Smoker and Patient abstained from smoking.,    Pulmonary exam normal        Cardiovascular negative cardio ROS Normal cardiovascular exam     Neuro/Psych negative neurological ROS     GI/Hepatic Neg liver ROS, PUD,   Endo/Other  negative endocrine ROS  Renal/GU negative Renal ROS     Musculoskeletal negative musculoskeletal ROS (+)   Abdominal   Peds  Hematology negative hematology ROS (+) anemia ,   Anesthesia Other Findings   Reproductive/Obstetrics                            Anesthesia Physical Anesthesia Plan  ASA: II  Anesthesia Plan: General   Post-op Pain Management:    Induction: Intravenous, Inhalational and Rapid sequence  PONV Risk Score and Plan: 2 and Ondansetron and Dexamethasone  Airway Management Planned: Oral ETT  Additional Equipment:   Intra-op Plan:   Post-operative Plan: Extubation in OR  Informed Consent: I have reviewed the patients History and Physical, chart, labs and discussed the procedure including the risks, benefits and alternatives for the proposed anesthesia with the patient or authorized representative who has indicated his/her understanding and acceptance.     Dental advisory given  Plan Discussed with: Anesthesiologist, CRNA and Surgeon  Anesthesia Plan Comments: (Nausea/vomiting today: RSI)       Anesthesia Quick Evaluation

## 2019-12-10 NOTE — Consult Note (Signed)
Referring Provider: Dr. Oswaldo Done Primary Care Physician:  Patient, No Pcp Per Primary Gastroenterologist:  Gentry Fitz  Reason for Consultation:  Bloody Diarrhea; Pancolitis  HPI: [name] is a 34 y.o. female with 2 week history of profuse bloody diarrhea, diffuse abdominal pain and chills. Denies previous episodes of bloody diarrhea. Abdominal pain would lessen following diarrheal episodes and was having nocturnal urges. Denies N/V/F. GI pathogen panel negative. CT shows pancolitis. Hgb 9.6 (9.1), MCV 72. No recent travel history.  Past Medical History:  Diagnosis Date  . Anemia     Past Surgical History:  Procedure Laterality Date  . dilation and curetage    . NO PAST SURGERIES      Prior to Admission medications   Medication Sig Start Date End Date Taking? Authorizing Provider  famotidine (PEPCID) 20 MG tablet Take 20 mg by mouth 2 (two) times daily.   Yes [provider]  ibuprofen (ADVIL) 200 MG tablet Take 400 mg by mouth every 6 (six) hours as needed.   Yes [provider]  loperamide (IMODIUM A-D) 2 MG tablet Take 2 mg by mouth 4 (four) times daily as needed for diarrhea or loose stools.   Yes [provider]  amoxicillin-clavulanate (AUGMENTIN) 875-125 MG tablet Take 1 tablet by mouth every 12 (twelve) hours. Patient not taking: Reported on 12/08/2019 01/29/17   Belinda Fisher, PA-C  cetirizine (ZYRTEC) 10 MG tablet Take 1 tablet (10 mg total) by mouth daily. Patient not taking: Reported on 12/08/2019 01/29/17   Belinda Fisher, PA-C  tiZANidine (ZANAFLEX) 4 MG capsule Take 1 capsule (4 mg total) by mouth 3 (three) times daily. Patient not taking: Reported on 12/08/2019 09/19/12   Adam Phenix, MD  traMADol (ULTRAM) 50 MG tablet Take 1 tablet (50 mg total) by mouth every 6 (six) hours as needed for pain. Patient not taking: Reported on 12/08/2019 10/02/12   Graylon Good, PA-C  trolamine salicylate (ASPERCREME) 10 % cream Apply 1 application topically as  needed (back pain). Patient not taking: Reported on 12/08/2019    [provider]    Scheduled Meds: . dicyclomine  10 mg Oral TID AC  . rivaroxaban  10 mg Oral Daily   Continuous Infusions: . dextrose 5 % and 0.45 % NaCl with KCl 20 mEq/L    . ferumoxytol 510 mg (12/10/19 1122)   PRN Meds:.acetaminophen **OR** acetaminophen, ketorolac, loperamide, ondansetron **OR** ondansetron (ZOFRAN) IV, promethazine  Allergies as of 12/08/2019  . (No Known Allergies)    Family History  Problem Relation Age of Onset  . Heart disease Mother   . Hypertension Father   . Diabetes Father   . Diabetes Maternal Grandmother   . Diabetes Paternal Grandfather     Social History   Socioeconomic History  . Marital status: Single    Spouse name: Not on file  . Number of children: Not on file  . Years of education: Not on file  . Highest education level: Not on file  Occupational History  . Not on file  Tobacco Use  . Smoking status: Current Every Day Smoker  . Smokeless tobacco: Never Used  Substance and Sexual Activity  . Alcohol use: Yes  . Drug use: No  . Sexual activity: Yes    Birth control/protection: Injection  Other Topics Concern  . Not on file  Social History Narrative  . Not on file   Social Determinants of Health   Financial Resource Strain:   . Difficulty of Paying  Living Expenses: Not on file  Food Insecurity:   . Worried About Programme researcher, broadcasting/film/video in the Last Year: Not on file  . Ran Out of Food in the Last Year: Not on file  Transportation Needs:   . Lack of Transportation (Medical): Not on file  . Lack of Transportation (Non-Medical): Not on file  Physical Activity:   . Days of Exercise per Week: Not on file  . Minutes of Exercise per Session: Not on file  Stress:   . Feeling of Stress : Not on file  Social Connections:   . Frequency of Communication with Friends and Family: Not on file  . Frequency of Social Gatherings with Friends and Family: Not on  file  . Attends Religious Services: Not on file  . Active Member of Clubs or Organizations: Not on file  . Attends Banker Meetings: Not on file  . Marital Status: Not on file  Intimate Partner Violence:   . Fear of Current or Ex-Partner: Not on file  . Emotionally Abused: Not on file  . Physically Abused: Not on file  . Sexually Abused: Not on file    Review of Systems: All negative except as stated above in HPI.  Physical Exam: Vital signs: Vitals:   12/10/19 0402 12/10/19 0717  BP: 92/70 93/65  Pulse: 87 97  Resp: 14   Temp: 98 F (36.7 C) 98.5 F (36.9 C)  SpO2: 99% 100%   Last BM Date: 12/09/19 General:  Lethargic, Well-developed, well-nourished, pleasant and cooperative in NAD Head: normocephalic, atraumatic Eyes: anicteric sclera ENT: oropharynx clear Neck: supple, nontender Lungs:  Clear throughout to auscultation.   No wheezes, crackles, or rhonchi. No acute distress. Heart:  Regular rate and rhythm; no murmurs, clicks, rubs,  or gallops. Abdomen: Left-sided tenderness with guarding, soft, nondistended, +BS  Rectal:  Deferred Ext: no edema  GI:  Lab Results: Recent Labs    12/08/19 2307 12/09/19 0545 12/10/19 0819  WBC 17.6* 19.3* 13.8*  HGB 10.1* 9.1* 9.6*  HCT 29.3* 26.9* 27.9*  PLT 469* 441* 532*   BMET Recent Labs    12/08/19 2307 12/09/19 0545 12/10/19 0819  NA 132* 132* 132*  K 3.9 3.3* 3.2*  CL 99 100 99  CO2 23 21* 25  GLUCOSE 88 77 122*  BUN 5* 5* <5*  CREATININE 0.77 0.77 0.62  CALCIUM 6.9* 7.2* 7.3*   LFT Recent Labs    12/10/19 0819  PROT 5.9*  ALBUMIN 1.2*  AST 11*  ALT 8  ALKPHOS 60  BILITOT 0.6   PT/INR Recent Labs    12/08/19 2307 12/09/19 0545  LABPROT 14.5 16.2*  INR 1.2 1.4*     Studies/Results: DG Chest 2 View  Result Date: 12/08/2019 CLINICAL DATA:  Chest pain and tachycardia EXAM: CHEST - 2 VIEW COMPARISON:  None. FINDINGS: The heart size and mediastinal contours are within normal  limits. Both lungs are clear. The visualized skeletal structures are unremarkable. IMPRESSION: No active cardiopulmonary disease. Electronically Signed   By: Jonna Clark M.D.   On: 12/08/2019 18:55   CT ABDOMEN PELVIS W CONTRAST  Result Date: 12/08/2019 CLINICAL DATA:  Acute nonlocalized upper abdominal pain right upper quadrant abdominal pain, bloody stools EXAM: CT ABDOMEN AND PELVIS WITH CONTRAST TECHNIQUE: Multidetector CT imaging of the abdomen and pelvis was performed using the standard protocol following bolus administration of intravenous contrast. CONTRAST:  OMNIPAQUE IOHEXOL 300 MG/ML  SOLN COMPARISON:  None. FINDINGS: Lower chest: Lung bases are  clear. Normal heart size. No pericardial effusion. Hepatobiliary: Tiny subcentimeter hypoattenuating focus in the posterior right lobe liver too small to fully characterize on CT imaging but statistically likely benign. (3/15). No worrisome focal liver lesions. Smooth liver surface contour. Normal hepatic attenuation. Pancreas: No pancreatic ductal dilatation or surrounding inflammatory changes. Spleen: Normal in size. No concerning splenic lesions. Adrenals/Urinary Tract: Normal adrenal glands. Kidneys are normally located with symmetric enhancement. No suspicious renal lesion, urolithiasis or hydronephrosis. Early excretion of contrast likely reflecting excellent renal function with some dependently layering contrast media within the urinary bladder. Urinary bladder otherwise free of acute abnormality. Stomach/Bowel: Distal esophagus, stomach and duodenal sweep are unremarkable. No small bowel wall thickening or dilatation. No evidence of obstruction. A normal appendix is visualized. There is diffuse pancolonic edematous mural thickening with mucosal hyperemia. No extraluminal gas or free fluid nor organized collection or abscess. No pneumatosis or portal venous gas. Vascular/Lymphatic: No significant vascular findings are present. No enlarged  abdominal or pelvic lymph nodes. Reproductive: Anteverted uterus. Suspect a small dorsal right uterine fibroid with some central hypoattenuation measuring approximately 2 cm in size. No concerning adnexal lesions with normal follicles bilaterally. Other: No abdominopelvic free fluid or free gas. No bowel containing hernias. Musculoskeletal: No acute osseous abnormality or suspicious osseous lesion. IMPRESSION: 1. Diffuse pancolonic edematous mural thickening with mucosal hyperemia, consistent with colitis, either infectious or inflammatory in etiology. No evidence of perforation or abscess formation. 2. Suspect a small dorsal uterine fibroid. Could consider outpatient evaluation with sonography as clinically warranted. Electronically Signed   By: Kreg Shropshire M.D.   On: 12/08/2019 20:30    Impression/Plan: Bloody diarrhea with pancolitis on CT likely due to ulcerative colitis. GI pathogen panel negative. Miralax prep today. Colonoscopy tomorrow to evaluate for ulcerative colitis. Clear liquid diet. NPO p MN. Dr. Levora Angel to do colonoscopy tomorrow morning. Patient agreeable to proceed.    LOS: 1 day   Shirley Friar  12/10/2019, 11:44 AM  Questions please call [phone]

## 2019-12-10 NOTE — Progress Notes (Addendum)
Subjective:  No acute events overnight. Patient had asked the RN about the need for antibiotics.  Patient was seen and examined at bedside this morning. She was lying in bed, eating her breakfast. She continues to be on a clear liquid diet. She is unsure of how many loose stools she had overnight. She did not count, but does not feel it has gotten any better or worse. The stools remain a watery consistency, and blood was still noted this morning. She reported feeling a "crampy" sensation just before bowel movements, and said the cramping remained after defecating. She also said she still has nausea prior to defecation. She denies vomiting.    Objective:  Vital signs in last 24 hours: Vitals:   12/10/19 0030 12/10/19 0100 12/10/19 0402 12/10/19 0717  BP: 105/67 103/62 92/70 93/65   Pulse: (!) 115 94 87 97  Resp:  20 14   Temp:  98.5 F (36.9 C) 98 F (36.7 C) 98.5 F (36.9 C)  TempSrc:  Oral Oral Oral  SpO2: 100% 99% 99% 100%  Weight:  61.1 kg    Height:  4\' 11"  (1.499 m)     Weight change: -0.589 kg  Intake/Output Summary (Last 24 hours) at 12/10/2019 1032 Last data filed at 12/10/2019 0400 Gross per 24 hour  Intake 1919.57 ml  Output --  Net 1919.57 ml   Physical Exam: General: Pleasant, no acute distress  CV: RRR, No m/r/g  Abdomen: Soft, non-distended. Generalized tenderness, more prominently in epigastric and LLQ regions  MSK: No pitting edema.  Skin: No rashes appreciated   Assessment/Plan:  Principal Problem:   Pancolitis (HCC) Active Problems:   Hypoalbuminemia   Hypokalemia   Microcytic anemia  Ms. Grandt is 34yo female with iron deficiency anemia admitted 11/7 with pancolitis concerning for inflammatory disease process.  #Pancolitis #Hematochezia Patient continues to complain of "crampy" abdominal pain, diarrhea, nausea, and decreased po intake. She doesn't like the hospital food, but is doing her best to consume the clear liquids. She continues to have  multiple episodes of watery diarrhea, with hematochezia. No previous episodes or family history GI illnesses. Labs notable for hypoalbuminemia, leukocytosis, elevated eosinophils, microcytic anemia, thrombocytosis, elevated sed rate, elevated CRP. CT abdomen/pelvis in ED showed diffuse pan-colonic edematous mural thickening with mucosal hyperemia. Given her symptoms and demographics, concern for inflammatory bowel disease. GI panel is negative, which makes an infectious etiology less likely. GI was consulted regarding a colonoscopy for likely inflammatory disease process. Colonoscopy scheduled for tomorrow AM.  - GI panel is negative - F/u BCx and UCx still pending - Neg C diff - Hep A, FOBT negative - Zofran, phenergan PRN for nausea - Ketorolac (Toradol) 15 mg/mL IV q8h PRN for pain - Order Bentyl (dicyclomine) for intestinal spasms - Order Imodium (loperamide) for diarrhea  - Clear liquid diet - GI consulted, colonoscopy is scheduled for tomorrow AM, Miralax prep today, NPO after MN  #Severe hypoalbuminemia Today's albumin is 1.2. Given clinical picture, at risk for protein-waisting enteropathy. Nephrotic syndrome ruled out with normal UPC. Anticipating this will improve as we treat the pancolitis. - Clear diet as of now, can consider RD consult as able to tolerate po  #Hypoproliferative microcytic anemia Patient reports history of iron deficiency and anemia of pregnancy. HgB is 9.6, MCV 72. Reticulocyte index 0.76. Iron panel results indicative of iron deficiency anemia, however a normal Ferritin of 99 supports the idea of a co-existing inflammatory response.  - Fe 12, UIBC 121, TIBC, 133, Saturation ratios  9 - Ferritin 99 - IV Feraheme 510 mg in sodium chloride 0.9% 100 mL, 1 dose - F/u CBC in AM  #Hypovolemic hyponatremia Sodium levels have not improved with IVFs since yesterday. Continue to hydrate today with D5 1/2NS with Kcl 20 mEq/L. Will re-check values in the morning. -  Continue IVF today - F/u CBC in AM  #Hypokalemia K+ 3.4 on arrival, continues to be decreased today at 3.2. Mg is 1.8. Most likely 2/2 GI loss given her many BM/day over last few weeks. Repleting, will re-check again in AM. - IVF: Dextrose 5 with 1/2 NS with Kcl 20 mEq/L, continuous @ 100 mL/hr - F/u AM K  #Prediabetes A1c 5.7 on admission. No previous history of diabetes or diabetic medications. - Will monitor glucose, especially if steroids are needed for IBD  DIET: Clear, NPO after MN IVF: D5 1/2 NS with Kcl 20 mEq/L today DVT PPX: Xarelto 10mg  daily BOWEL: Imodium and Bentyl CODE: FULL FAM COM: Will discuss with patient if she would like to communicate with a family member.  Prior to Admission Living Arrangement: Home Anticipated Discharge Location: Home Barriers to Discharge: medical management Dispo: Anticipated discharge in approximately 3-4 day(s).    LOS: 1 day   [place], Medical Student 12/10/2019, 10:32 AM

## 2019-12-10 NOTE — Plan of Care (Signed)
  Problem: Education: Goal: Knowledge of General Education information will improve Description Including pain rating scale, medication(s)/side effects and non-pharmacologic comfort measures Outcome: Progressing   

## 2019-12-10 NOTE — H&P (View-Only) (Signed)
Referring Provider: Dr. Oswaldo Done Primary Care Physician:  Patient, No Pcp Per Primary Gastroenterologist:  Gentry Fitz  Reason for Consultation:  Bloody Diarrhea; Pancolitis  HPI: [name] is a 34 y.o. female with 2 week history of profuse bloody diarrhea, diffuse abdominal pain and chills. Denies previous episodes of bloody diarrhea. Abdominal pain would lessen following diarrheal episodes and was having nocturnal urges. Denies N/V/F. GI pathogen panel negative. CT shows pancolitis. Hgb 9.6 (9.1), MCV 72. No recent travel history.  Past Medical History:  Diagnosis Date  . Anemia     Past Surgical History:  Procedure Laterality Date  . dilation and curetage    . NO PAST SURGERIES      Prior to Admission medications   Medication Sig Start Date End Date Taking? Authorizing Provider  famotidine (PEPCID) 20 MG tablet Take 20 mg by mouth 2 (two) times daily.   Yes [provider]  ibuprofen (ADVIL) 200 MG tablet Take 400 mg by mouth every 6 (six) hours as needed.   Yes [provider]  loperamide (IMODIUM A-D) 2 MG tablet Take 2 mg by mouth 4 (four) times daily as needed for diarrhea or loose stools.   Yes [provider]  amoxicillin-clavulanate (AUGMENTIN) 875-125 MG tablet Take 1 tablet by mouth every 12 (twelve) hours. Patient not taking: Reported on 12/08/2019 01/29/17   Belinda Fisher, PA-C  cetirizine (ZYRTEC) 10 MG tablet Take 1 tablet (10 mg total) by mouth daily. Patient not taking: Reported on 12/08/2019 01/29/17   Belinda Fisher, PA-C  tiZANidine (ZANAFLEX) 4 MG capsule Take 1 capsule (4 mg total) by mouth 3 (three) times daily. Patient not taking: Reported on 12/08/2019 09/19/12   Adam Phenix, MD  traMADol (ULTRAM) 50 MG tablet Take 1 tablet (50 mg total) by mouth every 6 (six) hours as needed for pain. Patient not taking: Reported on 12/08/2019 10/02/12   Graylon Good, PA-C  trolamine salicylate (ASPERCREME) 10 % cream Apply 1 application topically as  needed (back pain). Patient not taking: Reported on 12/08/2019    [provider]    Scheduled Meds: . dicyclomine  10 mg Oral TID AC  . rivaroxaban  10 mg Oral Daily   Continuous Infusions: . dextrose 5 % and 0.45 % NaCl with KCl 20 mEq/L    . ferumoxytol 510 mg (12/10/19 1122)   PRN Meds:.acetaminophen **OR** acetaminophen, ketorolac, loperamide, ondansetron **OR** ondansetron (ZOFRAN) IV, promethazine  Allergies as of 12/08/2019  . (No Known Allergies)    Family History  Problem Relation Age of Onset  . Heart disease Mother   . Hypertension Father   . Diabetes Father   . Diabetes Maternal Grandmother   . Diabetes Paternal Grandfather     Social History   Socioeconomic History  . Marital status: Single    Spouse name: Not on file  . Number of children: Not on file  . Years of education: Not on file  . Highest education level: Not on file  Occupational History  . Not on file  Tobacco Use  . Smoking status: Current Every Day Smoker  . Smokeless tobacco: Never Used  Substance and Sexual Activity  . Alcohol use: Yes  . Drug use: No  . Sexual activity: Yes    Birth control/protection: Injection  Other Topics Concern  . Not on file  Social History Narrative  . Not on file   Social Determinants of Health   Financial Resource Strain:   . Difficulty of Paying  Living Expenses: Not on file  Food Insecurity:   . Worried About Programme researcher, broadcasting/film/video in the Last Year: Not on file  . Ran Out of Food in the Last Year: Not on file  Transportation Needs:   . Lack of Transportation (Medical): Not on file  . Lack of Transportation (Non-Medical): Not on file  Physical Activity:   . Days of Exercise per Week: Not on file  . Minutes of Exercise per Session: Not on file  Stress:   . Feeling of Stress : Not on file  Social Connections:   . Frequency of Communication with Friends and Family: Not on file  . Frequency of Social Gatherings with Friends and Family: Not on  file  . Attends Religious Services: Not on file  . Active Member of Clubs or Organizations: Not on file  . Attends Banker Meetings: Not on file  . Marital Status: Not on file  Intimate Partner Violence:   . Fear of Current or Ex-Partner: Not on file  . Emotionally Abused: Not on file  . Physically Abused: Not on file  . Sexually Abused: Not on file    Review of Systems: All negative except as stated above in HPI.  Physical Exam: Vital signs: Vitals:   12/10/19 0402 12/10/19 0717  BP: 92/70 93/65  Pulse: 87 97  Resp: 14   Temp: 98 F (36.7 C) 98.5 F (36.9 C)  SpO2: 99% 100%   Last BM Date: 12/09/19 General:  Lethargic, Well-developed, well-nourished, pleasant and cooperative in NAD Head: normocephalic, atraumatic Eyes: anicteric sclera ENT: oropharynx clear Neck: supple, nontender Lungs:  Clear throughout to auscultation.   No wheezes, crackles, or rhonchi. No acute distress. Heart:  Regular rate and rhythm; no murmurs, clicks, rubs,  or gallops. Abdomen: Left-sided tenderness with guarding, soft, nondistended, +BS  Rectal:  Deferred Ext: no edema  GI:  Lab Results: Recent Labs    12/08/19 2307 12/09/19 0545 12/10/19 0819  WBC 17.6* 19.3* 13.8*  HGB 10.1* 9.1* 9.6*  HCT 29.3* 26.9* 27.9*  PLT 469* 441* 532*   BMET Recent Labs    12/08/19 2307 12/09/19 0545 12/10/19 0819  NA 132* 132* 132*  K 3.9 3.3* 3.2*  CL 99 100 99  CO2 23 21* 25  GLUCOSE 88 77 122*  BUN 5* 5* <5*  CREATININE 0.77 0.77 0.62  CALCIUM 6.9* 7.2* 7.3*   LFT Recent Labs    12/10/19 0819  PROT 5.9*  ALBUMIN 1.2*  AST 11*  ALT 8  ALKPHOS 60  BILITOT 0.6   PT/INR Recent Labs    12/08/19 2307 12/09/19 0545  LABPROT 14.5 16.2*  INR 1.2 1.4*     Studies/Results: DG Chest 2 View  Result Date: 12/08/2019 CLINICAL DATA:  Chest pain and tachycardia EXAM: CHEST - 2 VIEW COMPARISON:  None. FINDINGS: The heart size and mediastinal contours are within normal  limits. Both lungs are clear. The visualized skeletal structures are unremarkable. IMPRESSION: No active cardiopulmonary disease. Electronically Signed   By: Jonna Clark M.D.   On: 12/08/2019 18:55   CT ABDOMEN PELVIS W CONTRAST  Result Date: 12/08/2019 CLINICAL DATA:  Acute nonlocalized upper abdominal pain right upper quadrant abdominal pain, bloody stools EXAM: CT ABDOMEN AND PELVIS WITH CONTRAST TECHNIQUE: Multidetector CT imaging of the abdomen and pelvis was performed using the standard protocol following bolus administration of intravenous contrast. CONTRAST:  OMNIPAQUE IOHEXOL 300 MG/ML  SOLN COMPARISON:  None. FINDINGS: Lower chest: Lung bases are  clear. Normal heart size. No pericardial effusion. Hepatobiliary: Tiny subcentimeter hypoattenuating focus in the posterior right lobe liver too small to fully characterize on CT imaging but statistically likely benign. (3/15). No worrisome focal liver lesions. Smooth liver surface contour. Normal hepatic attenuation. Pancreas: No pancreatic ductal dilatation or surrounding inflammatory changes. Spleen: Normal in size. No concerning splenic lesions. Adrenals/Urinary Tract: Normal adrenal glands. Kidneys are normally located with symmetric enhancement. No suspicious renal lesion, urolithiasis or hydronephrosis. Early excretion of contrast likely reflecting excellent renal function with some dependently layering contrast media within the urinary bladder. Urinary bladder otherwise free of acute abnormality. Stomach/Bowel: Distal esophagus, stomach and duodenal sweep are unremarkable. No small bowel wall thickening or dilatation. No evidence of obstruction. A normal appendix is visualized. There is diffuse pancolonic edematous mural thickening with mucosal hyperemia. No extraluminal gas or free fluid nor organized collection or abscess. No pneumatosis or portal venous gas. Vascular/Lymphatic: No significant vascular findings are present. No enlarged  abdominal or pelvic lymph nodes. Reproductive: Anteverted uterus. Suspect a small dorsal right uterine fibroid with some central hypoattenuation measuring approximately 2 cm in size. No concerning adnexal lesions with normal follicles bilaterally. Other: No abdominopelvic free fluid or free gas. No bowel containing hernias. Musculoskeletal: No acute osseous abnormality or suspicious osseous lesion. IMPRESSION: 1. Diffuse pancolonic edematous mural thickening with mucosal hyperemia, consistent with colitis, either infectious or inflammatory in etiology. No evidence of perforation or abscess formation. 2. Suspect a small dorsal uterine fibroid. Could consider outpatient evaluation with sonography as clinically warranted. Electronically Signed   By: Kreg Shropshire M.D.   On: 12/08/2019 20:30    Impression/Plan: Bloody diarrhea with pancolitis on CT likely due to ulcerative colitis. GI pathogen panel negative. Miralax prep today. Colonoscopy tomorrow to evaluate for ulcerative colitis. Clear liquid diet. NPO p MN. Dr. Levora Angel to do colonoscopy tomorrow morning. Patient agreeable to proceed.    LOS: 1 day   Shirley Friar  12/10/2019, 11:44 AM  Questions please call [phone]

## 2019-12-11 ENCOUNTER — Inpatient Hospital Stay (HOSPITAL_COMMUNITY): Payer: Self-pay | Admitting: Anesthesiology

## 2019-12-11 ENCOUNTER — Encounter (HOSPITAL_COMMUNITY): Payer: Self-pay | Admitting: Student in an Organized Health Care Education/Training Program

## 2019-12-11 ENCOUNTER — Encounter (HOSPITAL_COMMUNITY)
Admission: EM | Disposition: A | Discharge status: Home / Self Care | Payer: Self-pay | Source: Home / Self Care | Attending: Student in an Organized Health Care Education/Training Program

## 2019-12-11 DIAGNOSIS — E871 Hypo-osmolality and hyponatremia: Secondary | ICD-10-CM

## 2019-12-11 HISTORY — PX: COLONOSCOPY: SHX5424

## 2019-12-11 HISTORY — PX: BIOPSY: SHX5522

## 2019-12-11 HISTORY — DX: Hypo-osmolality and hyponatremia: E87.1

## 2019-12-11 LAB — GLUCOSE, CAPILLARY
Glucose-Capillary: 132 mg/dL — ABNORMAL HIGH (ref 70–99)
Glucose-Capillary: 118 mg/dL — ABNORMAL HIGH (ref 70–99)
Glucose-Capillary: 222 mg/dL — ABNORMAL HIGH (ref 70–99)
Glucose-Capillary: 260 mg/dL — ABNORMAL HIGH (ref 70–99)
Glucose-Capillary: 171 mg/dL — ABNORMAL HIGH (ref 70–99)

## 2019-12-11 LAB — CBC
HCT: 27.7 % — ABNORMAL LOW (ref 36.0–46.0)
Hemoglobin: 9.4 g/dL — ABNORMAL LOW (ref 12.0–15.0)
MCH: 25.1 pg — ABNORMAL LOW (ref 26.0–34.0)
MCHC: 33.9 g/dL (ref 30.0–36.0)
MCV: 73.9 fL — ABNORMAL LOW (ref 80.0–100.0)
Platelets: 511 10*3/uL — ABNORMAL HIGH (ref 150–400)
RBC: 3.75 MIL/uL — ABNORMAL LOW (ref 3.87–5.11)
RDW: 13.6 % (ref 11.5–15.5)
WBC: 13.6 10*3/uL — ABNORMAL HIGH (ref 4.0–10.5)
nRBC: 0 % (ref 0.0–0.2)

## 2019-12-11 LAB — COMPREHENSIVE METABOLIC PANEL
ALT: 9 U/L (ref 0–44)
AST: 13 U/L — ABNORMAL LOW (ref 15–41)
Albumin: 1.3 g/dL — ABNORMAL LOW (ref 3.5–5.0)
Alkaline Phosphatase: 60 U/L (ref 38–126)
Anion gap: 8 (ref 5–15)
BUN: <5 mg/dL — ABNORMAL LOW (ref 6–20)
CO2: 24 mmol/L (ref 22–32)
Calcium: 7.6 mg/dL — ABNORMAL LOW (ref 8.9–10.3)
Chloride: 104 mmol/L (ref 98–111)
Creatinine, Ser: 0.68 mg/dL (ref 0.44–1.00)
GFR, Estimated: >60 mL/min (ref >60)
Glucose, Bld: 116 mg/dL — ABNORMAL HIGH (ref 70–99)
Potassium: 3.5 mmol/L (ref 3.5–5.1)
Sodium: 136 mmol/L (ref 135–145)
Total Bilirubin: 0.5 mg/dL (ref 0.3–1.2)
Total Protein: 5.6 g/dL — ABNORMAL LOW (ref 6.5–8.1)

## 2019-12-11 LAB — HEPATITIS B SURFACE ANTIGEN: Hepatitis B Surface Ag: NONREACTIVE

## 2019-12-11 SURGERY — COLONOSCOPY
Anesthesia: General

## 2019-12-11 MED ORDER — LIDOCAINE 2% (20 MG/ML) 5 ML SYRINGE
INTRAMUSCULAR | Status: DC | PRN
  Administered 2019-12-11: 100 mg via INTRAVENOUS

## 2019-12-11 MED ORDER — METHYLPREDNISOLONE SODIUM SUCC 125 MG IJ SOLR
60.0000 mg | Freq: Two times a day (BID) | INTRAMUSCULAR | Status: DC
  Administered 2019-12-11 – 2019-12-12 (×4): 60 mg via INTRAVENOUS
  Filled 2019-12-11 (×4): qty 2

## 2019-12-11 MED ORDER — PROPOFOL 10 MG/ML IV BOLUS
INTRAVENOUS | Status: DC | PRN
  Administered 2019-12-11: 180 mg via INTRAVENOUS

## 2019-12-11 MED ORDER — MIDAZOLAM HCL (PF) 5 MG/ML IJ SOLN
INTRAMUSCULAR | Status: AC
  Filled 2019-12-11: qty 1

## 2019-12-11 MED ORDER — DEXAMETHASONE SODIUM PHOSPHATE 10 MG/ML IJ SOLN
INTRAMUSCULAR | Status: DC | PRN
  Administered 2019-12-11: 5 mg via INTRAVENOUS

## 2019-12-11 MED ORDER — ONDANSETRON HCL 4 MG/2ML IJ SOLN
INTRAMUSCULAR | Status: DC | PRN
  Administered 2019-12-11: 4 mg via INTRAVENOUS

## 2019-12-11 MED ORDER — LACTATED RINGERS IV SOLN: INTRAVENOUS | Status: DC | PRN

## 2019-12-11 MED ORDER — FENTANYL CITRATE (PF) 100 MCG/2ML IJ SOLN
INTRAMUSCULAR | Status: DC | PRN
  Administered 2019-12-11: 100 ug via INTRAVENOUS

## 2019-12-11 MED ORDER — PHENYLEPHRINE 40 MCG/ML (10ML) SYRINGE FOR IV PUSH (FOR BLOOD PRESSURE SUPPORT)
PREFILLED_SYRINGE | INTRAVENOUS | Status: DC | PRN
  Administered 2019-12-11: 160 ug via INTRAVENOUS
  Administered 2019-12-11: 80 ug via INTRAVENOUS
  Administered 2019-12-11: 160 ug via INTRAVENOUS

## 2019-12-11 MED ORDER — EPHEDRINE SULFATE 50 MG/ML IJ SOLN
INTRAMUSCULAR | Status: DC | PRN
  Administered 2019-12-11 (×2): 10 mg via INTRAVENOUS

## 2019-12-11 MED ORDER — FENTANYL CITRATE (PF) 100 MCG/2ML IJ SOLN
INTRAMUSCULAR | Status: AC
  Filled 2019-12-11: qty 2

## 2019-12-11 MED ORDER — SUCCINYLCHOLINE CHLORIDE 200 MG/10ML IV SOSY
PREFILLED_SYRINGE | INTRAVENOUS | Status: DC | PRN
  Administered 2019-12-11: 100 mg via INTRAVENOUS

## 2019-12-11 NOTE — Anesthesia Postprocedure Evaluation (Signed)
Anesthesia Post Note  Patient: [name]  Procedure(s) Performed: COLONOSCOPY (N/A ) BIOPSY     Patient location during evaluation: PACU Anesthesia Type: General Level of consciousness: sedated Pain management: pain level controlled Vital Signs Assessment: post-procedure vital signs reviewed and stable Respiratory status: spontaneous breathing and respiratory function stable Cardiovascular status: stable Postop Assessment: no apparent nausea or vomiting Anesthetic complications: no   No complications documented.  Last Vitals:  Vitals:   12/11/19 0951 12/11/19 1002  BP: (!) 106/57 (!) 100/48  Pulse: (!) 105 93  Resp: 19 19  Temp:    SpO2: 100% 98%    Last Pain:  Vitals:   12/11/19 1002  TempSrc:   PainSc: 0-No pain                 Ercil Cassis DANIEL

## 2019-12-11 NOTE — Interval H&P Note (Signed)
History and Physical Interval Note:  12/11/2019 8:45 AM  [name]  has presented today for surgery, with the diagnosis of pancolitis.  The various methods of treatment have been discussed with the patient and family. After consideration of risks, benefits and other options for treatment, the patient has consented to  Procedure(s): COLONOSCOPY (N/A) as a surgical intervention.  The patient's history has been reviewed, patient examined, no change in status, stable for surgery.  I have reviewed the patient's chart and labs.  Questions were answered to the patient's satisfaction.     [name]

## 2019-12-11 NOTE — Progress Notes (Addendum)
Subjective:  Over night, patient was placed on NPO after MN, completed Miralax prep for scheduled colonoscopy this AM. Patient c/o mild abdominal pain, RN provided heat pack. Stool is noted to be lightly yellow to clear in color.   Patient seen at bedside this PM after colonoscopy. Says her throat hurts some but doing okay. Feeling okay. No BM since procedure. Denies nausea, vomiting, or abdominal pain at this time. Discussed results of colonoscopy. Explained what ulcerative colitis is and treatment process. Discussed patient will need to be on long term medication.  Objective:  Vital signs in last 24 hours: Vitals:   12/11/19 0844 12/11/19 0943 12/11/19 0951 12/11/19 1002  BP: (!) 99/52 (!) 103/46 (!) 106/57 (!) 100/48  Pulse: 94 (!) 124 (!) 105 93  Resp: (!) 22 20 19 19   Temp: 97.6 F (36.4 C) (!) 97.2 F (36.2 C)  (!) 97.5 F (36.4 C)  TempSrc: Oral Temporal  Oral  SpO2: 100% 100% 100% 98%  Weight:      Height:       Weight change:   Intake/Output Summary (Last 24 hours) at 12/11/2019 1308 Last data filed at 12/11/2019 0939 Gross per 24 hour  Intake 1993.4 ml  Output --  Net 1993.4 ml   Physical Exam: General: Pleasant, no acute distress  CV: RRR, No m/r/g  Abdomen: Soft, non-distended. Normal bowel sounds.  MSK: No pitting edema.  Skin: No rashes appreciated  Assessment/Plan:  Principal Problem:   Ulcerative pancolitis (HCC) Active Problems:   Hypoalbuminemia   Hypokalemia   Microcytic anemia   Hyponatremia   Ms. How is 34yo female admitted 11/7 with pancolitis associated with dehydration, hyponatremia, hypokalemia, iron deficiency anemia, and hypoalbunemia concerning for an inflammatory disease process.  #Ulcerative Pancolitis CT abdomen/pelvis in ED showed diffuse pan-colonic edematous mural thickening with mucosal hyperemia. Colonoscopy showed severe inflammation with ulceration and spontaneous bleeding throughout the entire colon, which is  consistent with severe ulcerative pancolitis. Biopsies were taken. Patient provided some education materials regarding UC.  - GI is following, recs are appreciated - Start soft diet - Start IV solu-medro 60 mg, q12h - Zofran, phenergan PRN for nausea - Ketorolac (Toradol) 15 mg/mL IV q8h PRN for pain - Bentyl (dicyclomine) PRN for intestinal spasms - Imodium (loperamide) for diarrhea    #Severe hypoalbuminemia Today's albumin is 1.3. Given clinical picture,at riskfor protein-waistingenteropathy.Anticipating this will improve as we treat the pancolitis. - Start soft diet, can consider RD consult as able to tolerate po  #Hypoproliferative microcytic anemia CBC remains stable. Iron panel results indicative of iron deficiency anemia, however a normal Ferritin of 99 supports the idea of a co-existing inflammatory response.  - IV Feraheme 510 mg in sodium chloride 0.9% 100 mL (2nd dose) to be administered 72 hours after initial dose yesterday - F/u CBC in AM  #Hypovolemic hyponatremia, resolved Sodium levels have improved. Continue to monitor. - Discontinue IVFs for now, re-consider based on patient's PO intake  - Start soft diet, encourage PO intake - F/u CMP in AM  #Hypokalemia, resolved  Most likely 2/2 GI loss given her many BM/day over last few weeks. Potassium levels have improved. - Discontinue IVF: Dextrose 5 with 1/2 NS with Kcl 20 mEq/L, continuous @ 100 mL/hr - Start soft diet, encourage PO intake - F/u CMP in AM  #Prediabetes A1c 5.7 on admission. No previous history of diabetes or diabetic medications. - Will monitor glucose, especially since steroids are needed for IBD  DIET:Soft diet 13/7 DVT OVZ:CHYI  10mg  daily BOWEL:Imodium and Bentyl CODE:FULL   Prior to Admission Living Arrangement:Home Anticipated Discharge Location:Home Barriers to Discharge:medical management Dispo: Anticipated discharge in approximately3-4day(s).    LOS: 2  days   , Medical Student 12/11/2019, 1:08 PM

## 2019-12-11 NOTE — Brief Op Note (Addendum)
12/08/2019 - 12/11/2019  9:59 AM  PATIENT:  [name]  34 y.o. female  PRE-OPERATIVE DIAGNOSIS:  pancolitis  POST-OPERATIVE DIAGNOSIS:  severe colitis   PROCEDURE:  Procedure(s): COLONOSCOPY (N/A) BIOPSY  SURGEON:  Surgeon(s) and Role:    * Mykle Pascua, MD - Primary  Assessment ----------------- -Colonoscopy showed severe inflammation with ulceration and spontaneous bleeding in the entire colon.  Poor prep in the right colon.  Biopsies performed.  Normal terminal ileum.  Finding consistent with severe ulcerative pan colitis  Recommendations ------------------------- -Start soft diet -Start IV Solu-Medrol -Patient will need treatment with biological agent. -Check for QuantiFERON TB Gold, hepatitis B surface antigen and TPMT enzyme activity. -ok to Resume anticoagulation from GI standpoint -GI will follow  Kathi Der MD, FACP 12/11/2019, 10:01 AM  Contact #  [phone]

## 2019-12-11 NOTE — Hospital Course (Addendum)
Problems addressed:   Ulcerative Pancolitis: Patient presented with 2-3 weeks duration of crampy abdominal pain localized to the epigastric and LLQ, nausea, vomiting, decreased PO intake, 6-10 episodes of bloody diarrhea per day with night time awakenings. CT abdomen/pelvis in ED showed diffuse pan-colonic edematous mural thickening with mucosal hyperemia. Infectious etiology was ruled out. GI was consulted for colonoscopy which showed severe inflammation with ulceration and spontaneous bleeding throughout the entire colon, consistent with severe ulcerative pancolitis. Biopsy results confirmed diagnosis of moderately active colitis with no dysplasia or malignancy, consistent with IBD. Patient was started on steroids, but will require long term medication management. She should follow up with the IM outpatient clinic in one week, and with the GI specialist in 2-4 weeks.   Severe hypoalbuminemia On arrival, albumin significantly low at 1.7. Given clinical picture, at risk for protein-waisting enteropathy. Nephrotic syndrome ruled out with normal UPC.  It continued to decrease to 1.3. Anticipating this will improve as we treat the pancolitis.   Hypoproliferative microcytic anemia Patient reports history of iron deficiency and anemia of pregnancy. On arrival, Hgb 11.7, MCV 72. Reticulocyte index 0.76. Ferritin 99 , supports the idea of a co-existing inflammatory response.It continued to decrease to 9.6. repleted with IV Feraheme 510 mg in sodium chloride 0.9% 100 mL, 1 dose on 11/12, 2nd dose on 12/13/19   Hypovolemic hyponatremia Patient dry on exam, tachycardic. Na 131. Continue to hydrate today with dextrose/1/2NS. It improved with IV hydration.  Hypokalemia K+ 3.4 on arrival, most likely 2/2 GI loss given her many BM/day over last few weeks. They continued to decrease to 3.2. It resolved with Dextrose 5 with 1/2 NS with Kcl 20 mEq/L, continuous @ 100 mL/hr and was d/c on 11/10.    Steroid Induced  Hyperglycemia and Chronic Prediabetes A1c 5.7 on admission. No previous history of diabetes or diabetic medications. She was observed for couple of days and then was started on Lantus 10 units.Patient will require insulin while on steroid treatment. She should follow up with IM outpatient clinic in one week for glucose check up and further optimization of insulin as necessary.

## 2019-12-11 NOTE — Transfer of Care (Signed)
Immediate Anesthesia Transfer of Care Note  Patient: [name]  Procedure(s) Performed: COLONOSCOPY (N/A ) BIOPSY  Patient Location: Endoscopy Unit  Anesthesia Type:General  Level of Consciousness: drowsy  Airway & Oxygen Therapy: Patient Spontanous Breathing and Patient connected to nasal cannula oxygen  Post-op Assessment: Report given to RN and Post -op Vital signs reviewed and stable  Post vital signs: Reviewed and stable  Last Vitals:  Vitals Value Taken Time  BP    Temp    Pulse 126 12/11/19 0942  Resp 23 12/11/19 0942  SpO2 98 % 12/11/19 0942  Vitals shown include unvalidated device data.  Last Pain:  Vitals:   12/11/19 0844  TempSrc: Oral  PainSc: 6          Complications: No complications documented.

## 2019-12-11 NOTE — Anesthesia Procedure Notes (Signed)
Procedure Name: Intubation Date/Time: 12/11/2019 9:23 AM Performed by: Elliot Dally, CRNA Pre-anesthesia Checklist: Patient identified, Emergency Drugs available, Suction available and Patient being monitored Patient Re-evaluated:Patient Re-evaluated prior to induction Oxygen Delivery Method: Circle System Utilized Preoxygenation: Pre-oxygenation with 100% oxygen Induction Type: IV induction, Rapid sequence and Cricoid Pressure applied Laryngoscope Size: Miller and 2 Grade View: Grade I Tube type: Oral Tube size: 7.0 mm Number of attempts: 1 Airway Equipment and Method: Stylet and Oral airway Placement Confirmation: ETT inserted through vocal cords under direct vision,  positive ETCO2 and breath sounds checked- equal and bilateral Secured at: 21 cm Tube secured with: Tape Dental Injury: Teeth and Oropharynx as per pre-operative assessment

## 2019-12-11 NOTE — Plan of Care (Signed)
Patient is currently resting in bed.  C/o mild abd pain, heat packs applied. Bowel prep overnight for colonoscopy today. NPO. Stool currently light yellow to clear. OOB ambulating in room. MIVF. Call bell within reach.   Problem: Education: Goal: Knowledge of General Education information will improve Description: Including pain rating scale, medication(s)/side effects and non-pharmacologic comfort measures Outcome: Progressing   Problem: Health Behavior/Discharge Planning: Goal: Ability to manage health-related needs will improve Outcome: Progressing   Problem: Clinical Measurements: Goal: Ability to maintain clinical measurements within normal limits will improve Outcome: Progressing Goal: Will remain free from infection Outcome: Progressing Goal: Diagnostic test results will improve Outcome: Progressing Goal: Respiratory complications will improve Outcome: Progressing Goal: Cardiovascular complication will be avoided Outcome: Progressing   Problem: Activity: Goal: Risk for activity intolerance will decrease Outcome: Progressing   Problem: Nutrition: Goal: Adequate nutrition will be maintained Outcome: Progressing   Problem: Coping: Goal: Level of anxiety will decrease Outcome: Progressing   Problem: Elimination: Goal: Will not experience complications related to bowel motility Outcome: Progressing Goal: Will not experience complications related to urinary retention Outcome: Progressing   Problem: Pain Managment: Goal: General experience of comfort will improve Outcome: Progressing   Problem: Safety: Goal: Ability to remain free from injury will improve Outcome: Progressing   Problem: Skin Integrity: Goal: Risk for impaired skin integrity will decrease Outcome: Progressing

## 2019-12-11 NOTE — Op Note (Signed)
[facility] Patient Name: [name] Procedure Date : 12/11/2019 MRN: [id number] Attending MD: Kathi Der , MD Date of Birth: 1985-05-19 CSN: [id number] Age: 34 Admit Type: Inpatient Procedure:                Colonoscopy Indications:              Suspected chronic ulcerative pancolitis, Abnormal                            CT of the GI tract Providers:                Kathi Der, MD, Adolph Pollack, RN, Beryle Beams, Technician, Elliot Dally CRNA Referring MD:              Medicines:                 Complications:            No immediate complications. Estimated Blood Loss:     Estimated blood loss was minimal. Procedure:                Pre-Anesthesia Assessment:                           - Prior to the procedure, a History and Physical                            was performed, and patient medications and                            allergies were reviewed. The patient's tolerance of                            previous anesthesia was also reviewed. The risks                            and benefits of the procedure and the sedation                            options and risks were discussed with the patient.                            All questions were answered, and informed consent                            was obtained. Prior Anticoagulants: The patient has                            taken Xarelto (rivaroxaban), last dose was 1 day                            prior to procedure. ASA Grade Assessment: II - A  patient with mild systemic disease. After reviewing                            the risks and benefits, the patient was deemed in                            satisfactory condition to undergo the procedure.                           After obtaining informed consent, the colonoscope                            was passed under direct vision. Throughout the                            procedure, the  patient's blood pressure, pulse, and                            oxygen saturations were monitored continuously. The                            PCF-H190DL ([id number]) Olympus pediatric colonoscope                            was introduced through the anus and advanced to the                            the terminal ileum, with identification of the                            ileocecal valve. The colonoscopy was performed                            without difficulty. The patient tolerated the                            procedure well. The quality of the bowel                            preparation was poor. Scope In: 9:18:23 AM Scope Out: 9:32:45 AM Scope Withdrawal Time: 0 hours 9 minutes 35 seconds  Total Procedure Duration: 0 hours 14 minutes 22 seconds  Findings:      The perianal and digital rectal examinations were normal.      The terminal ileum appeared normal.      Inflammation was found in a continuous and circumferential pattern from       the rectum to the cecum. This was graded as Mayo Score 3 (severe, with       spontaneous bleeding, ulcerations). Biopsies were taken with a cold       forceps for histology.      No additional abnormalities were found on retroflexion. Impression:               - Preparation of the colon was poor.                           -  The examined portion of the ileum was normal.                           - Severe (Mayo Score 3) pancolitis ulcerative                            colitis. Biopsied. Recommendation:           - Return patient to hospital ward for ongoing care.                           - Soft diet.                           - Await pathology results. Procedure Code(s):        --- Professional ---                           [phone], Colonoscopy, flexible; with biopsy, single                            or multiple Diagnosis Code(s):        --- Professional ---                           K51.00, Ulcerative (chronic) pancolitis without                             complications                           R93.3, Abnormal findings on diagnostic imaging of                            other parts of digestive tract CPT copyright 2019 [employer] Medical [employer]. All rights reserved. The codes documented in this report are preliminary and upon coder review may  be revised to meet current compliance requirements. Kathi Der, MD Kathi Der, MD 12/11/2019 10:02:28 AM Number of Addenda: 0

## 2019-12-12 LAB — CBC
HCT: 26.8 % — ABNORMAL LOW (ref 36.0–46.0)
Hemoglobin: 9.1 g/dL — ABNORMAL LOW (ref 12.0–15.0)
MCH: 24.6 pg — ABNORMAL LOW (ref 26.0–34.0)
MCHC: 34 g/dL (ref 30.0–36.0)
MCV: 72.4 fL — ABNORMAL LOW (ref 80.0–100.0)
Platelets: 562 10*3/uL — ABNORMAL HIGH (ref 150–400)
RBC: 3.7 MIL/uL — ABNORMAL LOW (ref 3.87–5.11)
RDW: 13.5 % (ref 11.5–15.5)
WBC: 11.7 10*3/uL — ABNORMAL HIGH (ref 4.0–10.5)
nRBC: 0.2 % (ref 0.0–0.2)

## 2019-12-12 LAB — GLUCOSE, CAPILLARY
Glucose-Capillary: 183 mg/dL — ABNORMAL HIGH (ref 70–99)
Glucose-Capillary: 157 mg/dL — ABNORMAL HIGH (ref 70–99)
Glucose-Capillary: 123 mg/dL — ABNORMAL HIGH (ref 70–99)
Glucose-Capillary: 184 mg/dL — ABNORMAL HIGH (ref 70–99)
Glucose-Capillary: 164 mg/dL — ABNORMAL HIGH (ref 70–99)

## 2019-12-12 LAB — COMPREHENSIVE METABOLIC PANEL
ALT: 21 U/L (ref 0–44)
AST: 32 U/L (ref 15–41)
Albumin: 1.3 g/dL — ABNORMAL LOW (ref 3.5–5.0)
Alkaline Phosphatase: 61 U/L (ref 38–126)
Anion gap: 7 (ref 5–15)
BUN: <5 mg/dL — ABNORMAL LOW (ref 6–20)
CO2: 23 mmol/L (ref 22–32)
Calcium: 8 mg/dL — ABNORMAL LOW (ref 8.9–10.3)
Chloride: 104 mmol/L (ref 98–111)
Creatinine, Ser: 0.71 mg/dL (ref 0.44–1.00)
GFR, Estimated: >60 mL/min (ref >60)
Glucose, Bld: 207 mg/dL — ABNORMAL HIGH (ref 70–99)
Potassium: 4.1 mmol/L (ref 3.5–5.1)
Sodium: 134 mmol/L — ABNORMAL LOW (ref 135–145)
Total Bilirubin: 0.4 mg/dL (ref 0.3–1.2)
Total Protein: 6 g/dL — ABNORMAL LOW (ref 6.5–8.1)

## 2019-12-12 LAB — SURGICAL PATHOLOGY

## 2019-12-12 MED ORDER — INSULIN GLARGINE 100 UNIT/ML ~~LOC~~ SOLN
10.0000 [IU] | Freq: Every day | SUBCUTANEOUS | Status: DC
  Administered 2019-12-12: 10 [IU] via SUBCUTANEOUS
  Filled 2019-12-12 (×2): qty 0.1

## 2019-12-12 MED ORDER — SODIUM CHLORIDE 0.9 % IV SOLN
510.0000 mg | Freq: Once | INTRAVENOUS | Status: AC
  Administered 2019-12-13: 510 mg via INTRAVENOUS
  Filled 2019-12-12: qty 17

## 2019-12-12 MED ORDER — INSULIN GLARGINE 100 UNIT/ML ~~LOC~~ SOLN: 10.0000 [IU] | Freq: Every day | SUBCUTANEOUS | Status: DC

## 2019-12-12 MED ORDER — INSULIN ASPART 100 UNIT/ML ~~LOC~~ SOLN
0.0000 [IU] | Freq: Three times a day (TID) | SUBCUTANEOUS | Status: DC
  Administered 2019-12-12: 3 [IU] via SUBCUTANEOUS
  Administered 2019-12-13: 2 [IU] via SUBCUTANEOUS
  Administered 2019-12-13: 3 [IU] via SUBCUTANEOUS

## 2019-12-12 MED ORDER — INSULIN GLARGINE 100 UNIT/ML ~~LOC~~ SOLN: 6.0000 [IU] | Freq: Every day | SUBCUTANEOUS | Status: DC

## 2019-12-12 NOTE — Progress Notes (Signed)
[facility]astroenterology Progress Note  [name] 34 y.o. [birth year]  CC: Pancolitis   Subjective: Colonoscopy yesterday showed severe pancolitis.  Was started on IV steroids yesterday.  Feeling better today.  Having formed stool.  Denies any vomiting.  Tolerating diet.  ROS : Afebrile, negative for chest pain   Objective: Vital signs in last 24 hours: Vitals:   12/12/19 0409 12/12/19 0754  BP: 94/62 100/66  Pulse: 63 (!) 56  Resp: 20 18  Temp: 98.3 F (36.8 C) 98.2 F (36.8 C)  SpO2: 99% 100%    Physical Exam:  General:  Alert, cooperative, no distress, appears stated age  Head:  Normocephalic, without obvious abnormality, atraumatic  Eyes:  , EOM's intact,   Lungs:   Clear to auscultation bilaterally, respirations unlabored  Heart:  Regular rate and rhythm, S1, S2 normal  Abdomen:    Mild generalized discomfort on palpation but no significant tenderness, bowel sounds present, no peritoneal signs.  Abdomen is soft.  Extremities: Extremities normal, atraumatic, no  edema  Pulses: 2+ and symmetric    Lab Results: Recent Labs    12/11/19 0038 12/12/19 0236  NA 136 134*  K 3.5 4.1  CL 104 104  CO2 24 23  GLUCOSE 116* 207*  BUN <5* <5*  CREATININE 0.68 0.71  CALCIUM 7.6* 8.0*   Recent Labs    12/11/19 0038 12/12/19 0236  AST 13* 32  ALT 9 21  ALKPHOS 60 61  BILITOT 0.5 0.4  PROT 5.6* 6.0*  ALBUMIN 1.3* 1.3*   Recent Labs    12/10/19 0819 12/10/19 0819 12/11/19 0038 12/12/19 0236  WBC 13.8*   < > 13.6* 11.7*  NEUTROABS 8.0*  --   --   --   HGB 9.6*   < > 9.4* 9.1*  HCT 27.9*   < > 27.7* 26.8*  MCV 72.7*   < > 73.9* 72.4*  PLT 532*   < > 511* 562*   < > = values in this interval not displayed.   No results for input(s): LABPROT, INR in the last 72 hours.    Assessment/Plan: -Severe panulcerative colitis.  Newly diagnosed.  Path report pending.  Recommendations ----------------------- -Continue IV steroids for today. -Advance diet to  regular -Hepatitis B surface antigen negative.  QuantiFERON-TB gold and TPMT pending.  Long-term management options discussed with the patient.  She prefers infusion rather than self injections.  We will consider starting her on biosimilar of Remicade as an outpatient. -We will also start low-dose Imuran once TPMT enzyme activity is confirmed -GI will follow.  Hopefully discharge  Kathi Der MD, FACP 12/12/2019, 11:39 AM  Contact #  [phone]

## 2019-12-12 NOTE — TOC Progression Note (Signed)
Transition of Care [facility]) - Progression Note    Patient Details  Name: [name] MRN: [id number] Date of Birth: Nov 06, 1985  [facility]) CM/SW Contact  Lockie Pares, RN Phone Number: 12/12/2019, 2:16 PM  Clinical Narrative:    Spoke to patient about insurance information. She has not been able to retrieve it yet, but will work on it this afternoon. Gave her a snapshot of what medicine cost would be without insurance for steriods, zofran, then she mentioned insulin. Once we get insurance information, will be able to figure out her co-pay.  She will be following up with me [phone]   Expected Discharge Plan: Home/Self Care Barriers to Discharge: Continued Medical Work up  Expected Discharge Plan and Services Expected Discharge Plan: Home/Self Care                                               Social Determinants of Health (SDOH) Interventions    Readmission Risk Interventions No flowsheet data found.

## 2019-12-12 NOTE — Progress Notes (Addendum)
Subjective:  No acute events overnight.   Patient evaluated at bedside this AM. She reports she feels much better this morning. Had one BM this morning and another yesterday evening. Abdominal pain improved as well. Has not eaten much food because hospital food does not taste good. Gave patient information yesterday regarding dx of ulcerative colitis, patient reports this morning she has no further questions. Discussed f/u with IMTS and GI clinic. Patient mentions she has health insurance. She inquires about discharge medication, discussed we will touch base with GI physicians. Discussed likely discharge in the next day or two pending continued clinical improvement.   Objective:  Vital signs in last 24 hours: Vitals:   12/11/19 2325 12/12/19 0300 12/12/19 0409 12/12/19 0754  BP: 105/72  94/62 100/66  Pulse: 90  63 (!) 56  Resp: 18  20 18   Temp: 97.9 F (36.6 C) 98 F (36.7 C) 98.3 F (36.8 C) 98.2 F (36.8 C)  TempSrc: Oral  Oral Oral  SpO2: 100%  99% 100%  Weight:      Height:       Weight change:  No intake or output data in the 24 hours ending 12/12/19 1052  Physical Exam: Gen: Pleasant, alert, interactive, well-appearing Resp: normal respiratory effort CV: RRR, normal s1/s2, no m/r/g ABD: soft, non-distended, non-tender, normal bowel sounds, no masses Skin: Intact, no rashes, no lesions, no erythema   Assessment/Plan:  Principal Problem:   Ulcerative pancolitis (HCC) Active Problems:   Hypoalbuminemia   Hypokalemia   Microcytic anemia   Hyponatremia   [name] is a 34 yo female with a history of iron deficiency anemia admitted on 11/7 with severe ulcerative pancolitis, and is currently stable with symptom relief after beginning steroid treatment yesterday.   #Ulcerative Pancolitis CT abdomen/pelvis in ED showed diffuse pan-colonic edematous mural thickening with mucosal hyperemia. Colonoscopy showed severe inflammation with ulceration and spontaneous  bleeding throughout the entire colon, which is consistent with severe ulcerative pancolitis. Biopsy results are still pending. Patient endorses improved symptom relief after starting IV steroids yesterday. She currently denies nausea, vomiting, abdominal pain, and has had 1-2 BM of formed stool. Per GI note, long-term management has been discussed with patient and will require follow up as outpatient. - GI is following, recs are appreciated - Advance to regular diet - Continue with IV solu-medro 60 mg, q12h for today - Zofran, phenergan PRN for nausea - Tylenol 650 mg q6h PRN for pain - Bentyl (dicyclomine) PRN for intestinal spasms   #Steroid Induced Hyperglycemia and Chronic Prediabetes A1c 5.7 on admission. No previous history of diabetes or diabetic medications. Today BMP glucose is 207, most likely due to starting the steroid yesterday.  - Lantus 10 units tonight, will think about meal time depending on SSI usage   #Severe hypoalbuminemia Given clinical picture, at risk for protein-waisting enteropathy. Anticipating this will improve as we treat the pancolitis. - Diet: regular   #Hypoproliferative microcytic anemia CBC remains stable. Iron panel results indicative of iron deficiency anemia, however a normal Ferritin of 99 supports the idea of a co-existing inflammatory response.  - IV Feraheme 510 mg in sodium chloride 0.9% 100 mL (2nd dose) to be administered 72 hours after initial dose on 12/10/19. - F/u CBC in AM   #Hypovolemic hyponatremia, resolved Sodium levels are stable. Continue to monitor. - Diet: regular - F/u CMP in AM   #Hypokalemia, resolved  Potassium levels are WNL. - Diet: regular - F/u CMP in AM  DIET: Regular IVF: none DVT PPX: Xarelto 10mg  daily BOWEL: Imodium and Bentyl CODE: FULL     Prior to Admission Living Arrangement: Home Anticipated Discharge Location: Home Barriers to Discharge: medical management Dispo: Anticipated discharge in  approximately 1-2 day(s).    LOS: 3 days   , Medical Student 12/12/2019, 10:52 AM

## 2019-12-13 ENCOUNTER — Other Ambulatory Visit (HOSPITAL_COMMUNITY): Payer: Self-pay | Admitting: Internal Medicine

## 2019-12-13 ENCOUNTER — Telehealth: Payer: Self-pay

## 2019-12-13 LAB — CBC
HCT: 27.8 % — ABNORMAL LOW (ref 36.0–46.0)
Hemoglobin: 9.4 g/dL — ABNORMAL LOW (ref 12.0–15.0)
MCH: 24.8 pg — ABNORMAL LOW (ref 26.0–34.0)
MCHC: 33.8 g/dL (ref 30.0–36.0)
MCV: 73.4 fL — ABNORMAL LOW (ref 80.0–100.0)
Platelets: 638 10*3/uL — ABNORMAL HIGH (ref 150–400)
RBC: 3.79 MIL/uL — ABNORMAL LOW (ref 3.87–5.11)
RDW: 13.8 % (ref 11.5–15.5)
WBC: 25 10*3/uL — ABNORMAL HIGH (ref 4.0–10.5)
nRBC: 0.2 % (ref 0.0–0.2)

## 2019-12-13 LAB — CULTURE, BLOOD (ROUTINE X 2)
Culture: NO GROWTH
Culture: NO GROWTH

## 2019-12-13 LAB — COMPREHENSIVE METABOLIC PANEL
ALT: 24 U/L (ref 0–44)
AST: 29 U/L (ref 15–41)
Albumin: 1.4 g/dL — ABNORMAL LOW (ref 3.5–5.0)
Alkaline Phosphatase: 72 U/L (ref 38–126)
Anion gap: 6 (ref 5–15)
BUN: 7 mg/dL (ref 6–20)
CO2: 25 mmol/L (ref 22–32)
Calcium: 8.2 mg/dL — ABNORMAL LOW (ref 8.9–10.3)
Chloride: 104 mmol/L (ref 98–111)
Creatinine, Ser: 0.71 mg/dL (ref 0.44–1.00)
GFR, Estimated: >60 mL/min (ref >60)
Glucose, Bld: 144 mg/dL — ABNORMAL HIGH (ref 70–99)
Potassium: 4.2 mmol/L (ref 3.5–5.1)
Sodium: 135 mmol/L (ref 135–145)
Total Bilirubin: 0.3 mg/dL (ref 0.3–1.2)
Total Protein: 6.1 g/dL — ABNORMAL LOW (ref 6.5–8.1)

## 2019-12-13 LAB — QUANTIFERON-TB GOLD PLUS (RQFGPL)
QuantiFERON Mitogen Value: 0.45 IU/mL
QuantiFERON Nil Value: 0.03 IU/mL
QuantiFERON TB1 Ag Value: 0.02 IU/mL
QuantiFERON TB2 Ag Value: 0.01 IU/mL

## 2019-12-13 LAB — GLUCOSE, CAPILLARY
Glucose-Capillary: 121 mg/dL — ABNORMAL HIGH (ref 70–99)
Glucose-Capillary: 157 mg/dL — ABNORMAL HIGH (ref 70–99)

## 2019-12-13 LAB — QUANTIFERON-TB GOLD PLUS: QuantiFERON-TB Gold Plus: UNDETERMINED — AB

## 2019-12-13 MED ORDER — INSULIN GLARGINE 100 UNIT/ML ~~LOC~~ SOLN
10.0000 [IU] | Freq: Every day | SUBCUTANEOUS | 0 refills | Status: DC
Start: 2019-12-13 — End: 2019-12-18

## 2019-12-13 MED ORDER — PREDNISONE 10 MG (21) PO TBPK: ORAL_TABLET | ORAL | 0 refills | Status: DC

## 2019-12-13 MED ORDER — ONDANSETRON HCL 4 MG PO TABS: 4.0000 mg | ORAL_TABLET | Freq: Four times a day (QID) | ORAL | 0 refills | Status: DC | PRN

## 2019-12-13 MED ORDER — SODIUM CHLORIDE 0.9 % IV BOLUS: 1000.0000 mL | Freq: Once | INTRAVENOUS | Status: DC

## 2019-12-13 MED ORDER — BLOOD GLUCOSE METER KIT: PACK | 0 refills | Status: DC

## 2019-12-13 MED ORDER — PREDNISONE 20 MG PO TABS
40.0000 mg | ORAL_TABLET | Freq: Every day | ORAL | Status: DC
  Administered 2019-12-13: 40 mg via ORAL
  Filled 2019-12-13: qty 2

## 2019-12-13 MED FILL — LANTUS 100 UNITS/ML VIAL: 100 | 30 days supply | Qty: 10 | Fill #0

## 2019-12-13 MED FILL — TRUE METRIX BLOOD GLUCOSE M: W/DEVICE | 1 days supply | Qty: 1 | Fill #0

## 2019-12-13 MED FILL — TRUEplus LANCETS 28G MISC: 25 days supply | Qty: 100 | Fill #0

## 2019-12-13 MED FILL — ULTICARE INS 0.3 ML 30GX1/2: 30G X 1/2" | 30 days supply | Qty: 30 | Fill #0

## 2019-12-13 MED FILL — TRUE METRIX GLUCOSE TEST ST: 25 days supply | Qty: 100 | Fill #0

## 2019-12-13 MED FILL — ONDANSETRON HCL 4 MG TABLET: 4 | 5 days supply | Qty: 20 | Fill #0

## 2019-12-13 MED FILL — predniSONE 10 MG TABS: 10 | 28 days supply | Qty: 70 | Fill #0

## 2019-12-13 NOTE — Discharge Summary (Addendum)
Name: [name] MRN: [id number] DOB: 1985/12/21 34 y.o. PCP: Patient, No Pcp Per  Date of Admission: 12/08/2019  4:27 PM Date of Discharge:  12/13/2019 Attending Physician: Axel Filler, *  Discharge Diagnosis: 1. Ulcerative colitis 2. Prediabetes 3. Hypoproliferative microcytic anemia  Discharge Medications: Allergies as of 12/13/2019   No Known Allergies      Medication List     STOP taking these medications    amoxicillin-clavulanate 875-125 MG tablet Commonly known as: AUGMENTIN   cetirizine 10 MG tablet Commonly known as: ZYRTEC   ibuprofen 200 MG tablet Commonly known as: ADVIL   tiZANidine 4 MG capsule Commonly known as: ZANAFLEX   traMADol 50 MG tablet Commonly known as: ULTRAM   trolamine salicylate 10 % cream Commonly known as: ASPERCREME       TAKE these medications    blood glucose meter kit and supplies Dispense based on patient and insurance preference. Use up to four times daily as directed. (FOR ICD-10 E10.9, E11.9).   famotidine 20 MG tablet Commonly known as: PEPCID Take 20 mg by mouth 2 (two) times daily.   insulin glargine 100 UNIT/ML injection Commonly known as: LANTUS Inject 0.1 mLs (10 Units total) into the skin at bedtime.   loperamide 2 MG tablet Commonly known as: IMODIUM A-D Take 2 mg by mouth 4 (four) times daily as needed for diarrhea or loose stools.   ondansetron 4 MG tablet Commonly known as: ZOFRAN Take 1 tablet (4 mg total) by mouth every 6 (six) hours as needed for nausea.   predniSONE 10 MG (21) Tbpk tablet Commonly known as: STERAPRED UNI-PAK 21 TAB Take 26m daily 11/13-11/19, 392mdaily 11/20-11/26, 208maily 11/27-12/3, and 60m73mily 12/3-12/10        Disposition and follow-up:   [name]  At the hospital follow up visit please address:  - Ulcerative colitis: Colonoscopy performed, consistent with IBD and  biopsy taken. Started on steroids and patient will see IMTS and GI for follow-up.  - Prediabetes: A1c 5.7 on arrival. Patient required insulin once started on steroids. Discharging patient with latnus 10 qhs while on steroids.  - Hypoproliferative microcytic anemia: Consistent with IDA. Received 2 doses of IV Feraheme. Can consider iron supplements.  2.  Labs / imaging needed at time of follow-up: CBC, BMP, Fe studies  3.  Pending labs/ test needing follow-up: Biopsy, Quantiferon-TB, TPMT  Follow-up Appointments:  Follow-up InfoHowelow up.   Why: You can call this clinic to establish with a PCP. There is a low cost pharmacy that can assist with medications at this location. Contact information: [phone]        [facility]. Schedule an appointment as soon as possible for a visit in 2 week(s).   Specialty: Gastroenterology Why: Follow-up for severe ulcerative colitis Contact information: [address] [phone]         [facility] on 12/18/2019.   Specialty: Internal Medicine Why: You have an appointment with us oKoreaWednesday, November 17th at 1:15PM. Please make sure to arrive 15 minutes early. Contact information: [id number] by problem list: 1. Ulcerative colitis: Patient  presented with diarrhea, abdominal pain, nausea/vomiting. GI panel negative for infection. Given demographics and symptoms, concern for IBD. GI consulted, colonoscopy revealed ulcers throughout colon. Started patient on IV steroids then was able to switch to po. Patient to follow-up with GI for biologics, immuno-modulators. Will prescribe 1 months worth of steroids at discharge.  2. Prediabetes: A1c 5.7 on  admission. Sugars increased as we started her on IV steroids. Given she will require steroids for a month, will start on lantus qhs to promote healing of GI ulcers. Patient to follow-up with IMTS for further management of sugars.  3. Hypoproliferative microcytic anemia: Iron studies consistent with iron deficiency anemia. Ferritin up, likely due to inflammatory process. Received 2 doses IV Feraheme. Suggest follow-up iron studies outpatient once her symptoms continue to improve.  Discharge Vitals:   BP (!) 91/57   Pulse (!) 52   Temp 97.9 F (36.6 C) (Oral)   Resp 16   Ht '4\' 11"'  (1.499 m)   Wt 61.1 kg   LMP 11/23/2019   SpO2 100%   BMI 27.21 kg/m   Pertinent Labs, Studies, and Procedures:  CBC Latest Ref Rng & Units 12/13/2019 12/12/2019 12/11/2019  WBC 4.0 - 10.5 K/uL 25.0(H) 11.7(H) 13.6(H)  Hemoglobin 12.0 - 15.0 g/dL 9.4(L) 9.1(L) 9.4(L)  Hematocrit 36 - 46 % 27.8(L) 26.8(L) 27.7(L)  Platelets 150 - 400 K/uL 638(H) 562(H) 511(H)   CMP Latest Ref Rng & Units 12/13/2019 12/12/2019 12/11/2019  Glucose 70 - 99 mg/dL 144(H) 207(H) 116(H)  BUN 6 - 20 mg/dL 7 <5(L) <5(L)  Creatinine 0.44 - 1.00 mg/dL 0.71 0.71 0.68  Sodium 135 - 145 mmol/L 135 134(L) 136  Potassium 3.5 - 5.1 mmol/L 4.2 4.1 3.5  Chloride 98 - 111 mmol/L 104 104 104  CO2 22 - 32 mmol/L '25 23 24  ' Calcium 8.9 - 10.3 mg/dL 8.2(L) 8.0(L) 7.6(L)  Total Protein 6.5 - 8.1 g/dL 6.1(L) 6.0(L) 5.6(L)  Total Bilirubin 0.3 - 1.2 mg/dL 0.3 0.4 0.5  Alkaline Phos 38 - 126 U/L 72 61 60  AST 15 - 41 U/L 29 32 13(L)  ALT 0 - 44 U/L '24 21 9   ' Colonoscopy 12/11/19 - Preparation of the colon was poor. - The examined portion of the ileum was normal. 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 Biopsied.  Discharge Instructions: [name], [name] were admitted to the hospital with abdominal pain and bloody diarrhea. You had a colonoscopy and were found to have severe ulcerative colitis and were treated with IV steroids  with improvement. On discharge, please continue to take your steroids and taper them as instructed (prednisone 43m daily for the first week, 365mdaily for the second week, 2055maily for the third week, and 60m1mily for the fourth week).  The steroids will cause increased blood sugars. On discharge, please continue to take lantus 10 units every night. Please check your blood sugars at home four times a day with meals and at night. Please bring a log of your readings to your follow up appointment in 1 week. If you experience any episodes of HYPOGLYCEMIA/LOW BLOOD SUGARS (<70), please contact us iKoreaediately at [phone] schedule a follow up visit with Dr BrahAlessandra Bevels2 weeks.    Thank you for allowing us tKoreaparticipate in your care    Signed: BrasSanjuan Dame 12/13/2019, 11:12 AM   Pager: [phone]

## 2019-12-13 NOTE — Progress Notes (Signed)
[facility] Gastroenterology Progress Note  [name] 34 y.o. 03/19/1985  CC: Pancolitis   Subjective: Patient seen and examined at bedside.  Complaining of lower abdominal discomfort and some nausea.  Denies any vomiting.  Having pebble-like stool.  Denies diarrhea.  She is ready to go home.  ROS : Afebrile, negative for chest pain   Objective: Vital signs in last 24 hours: Vitals:   12/12/19 2007 12/13/19 0635  BP: (!) 92/50 (!) 91/57  Pulse: 63 (!) 52  Resp: 16 16  Temp: 97.9 F (36.6 C) 97.9 F (36.6 C)  SpO2: 100% 100%    Physical Exam:  General:  Alert, cooperative, no distress, appears stated age  Head:  Normocephalic, without obvious abnormality, atraumatic  Eyes:  , EOM's intact,   Lungs:   Clear to auscultation bilaterally, respirations unlabored  Heart:  Regular rate and rhythm, S1, S2 normal  Abdomen:    Mild generalized discomfort on palpation in lower quadrants but no significant tenderness, bowel sounds present, no peritoneal signs.  Abdomen is soft.  Extremities: Extremities normal, atraumatic, no  edema  Pulses: 2+ and symmetric    Lab Results: Recent Labs    12/12/19 0236 12/13/19 0100  NA 134* 135  K 4.1 4.2  CL 104 104  CO2 23 25  GLUCOSE 207* 144*  BUN <5* 7  CREATININE 0.71 0.71  CALCIUM 8.0* 8.2*   Recent Labs    12/12/19 0236 12/13/19 0100  AST 32 29  ALT 21 24  ALKPHOS 61 72  BILITOT 0.4 0.3  PROT 6.0* 6.1*  ALBUMIN 1.3* 1.4*   Recent Labs    12/12/19 0236 12/13/19 0100  WBC 11.7* 25.0*  HGB 9.1* 9.4*  HCT 26.8* 27.8*  MCV 72.4* 73.4*  PLT 562* 638*   No results for input(s): LABPROT, INR in the last 72 hours.    Assessment/Plan: -Severe panulcerative colitis.  Newly diagnosed.  Path report confirmed moderate to severe colitis.  Recommendations ----------------------- -Change steroids to p.o. prednisone.  Start with 40 mg once a day for 1 week and taper to 30 mg once a day for another week then 20 mg once a day  for another week and then 10 mg once a day for another week. -Hepatitis B surface antigen negative.  QuantiFERON-TB gold and TPMT pending.  Long-term management options discussed with the patient.  She wants to discuss with family regarding use of biological agents. -We will also start low-dose Imuran once TPMT enzyme activity is confirmed -Avoid NSAIDs -Okay to discharge from GI standpoint.  We will arrange for follow-up in GI clinic in 2 to 4 weeks.  Kathi Der MD, FACP 12/13/2019, 9:43 AM  Contact #  [phone]

## 2019-12-13 NOTE — Progress Notes (Signed)
Patient was discharged home per MD order.  Discharge instructions reviewed and given to patient with care notes, medications, glucometer supplies, and prescriptions. Education regarding glucose checks with glucometer and drawing up/administering insulin given to patient with successful teach back. IV DIC; skin intact.  Patient will be escorted to the car by nurse tech via wheelchair.

## 2019-12-13 NOTE — Telephone Encounter (Signed)
Hospital TOC per Dr. Marijo Conception, discharge 12/13/2019 appt 12/18/2019.

## 2019-12-13 NOTE — TOC Transition Note (Addendum)
Transition of Care [facility]) - CM/SW Discharge Note   Patient Details  Name: [name] MRN: [id number] Date of Birth: [birth year]  [facility] CM/SW Contact:  Lockie Pares, RN Phone Number: 12/13/2019, 2:42 PM   Clinical Narrative:     Spoke with patient at bedside, patient states she called insurance company and they could not tell her policy number yet. This CM called insurance Friday health. They just added on [place]  And the insurance she just obtained is not activated until January 2022. Patient is currently uninsured, MATCH done for medication, pharmacy aware. Patient made aware her insurance does not start until January of 2022. She states she can make the co-pays on medications for MATCH. Primary Nurse Secure chat about situation . Patient knows not to leave until medications are delivered.   Final next level of care: Home/Self Care Barriers to Discharge: No Barriers Identified   Patient Goals and CMS Choice        Discharge Placement             Home self care          Discharge Plan and Services    Home Self care FU                                 Social Determinants of Health (SDOH) Interventions     Readmission Risk Interventions No flowsheet data found.

## 2019-12-13 NOTE — Progress Notes (Signed)
° °  Subjective:  No acute events overnight.   Patient seen and examined at bedside this morning. She was laying in bed watching a movie. She endorsed mild abdominal cramping in the LLQ during her bowel movements. She had 3 BMs yesterday, and 1 this morning. She described them as "small pellets." She denies n/v and diarrhea. She spoke with the GI specialist this morning. She would like to follow up with the IM outpatient clinic next week.   Objective:  Vital signs in last 24 hours: Vitals:   12/12/19 1547 12/12/19 1606 12/12/19 2007 12/13/19 0635  BP: (!) 88/52 (!) 93/52 (!) 92/50 (!) 91/57  Pulse: 75 60 63 (!) 52  Resp: 12 14 16 16   Temp:  98.4 F (36.9 C) 97.9 F (36.6 C) 97.9 F (36.6 C)  TempSrc:  Oral Oral Oral  SpO2: 100% 100% 100% 100%  Weight:      Height:       Weight change:   Intake/Output Summary (Last 24 hours) at 12/13/2019 1055 Last data filed at 12/13/2019 0000 Gross per 24 hour  Intake 480 ml  Output --  Net 480 ml   Physical Exam:  General: Pleasant, NAD, well-appearing, alert, interactive Resp: Normal respiratory efforts CV: RRR, normal s1/s2, no m/r/g ABD: soft, non-distended, non-tender, normal BS in all four quadrants Extremities: strength intact Neuro: AAOx3 Skin: intact, no rashes, no lesions, no erythema    Assessment/Plan:  Principal Problem:   Ulcerative pancolitis (HCC) Active Problems:   Hypoalbuminemia   Hypokalemia   Microcytic anemia   Hyponatremia   [name] is a 34 yo female with a history of iron deficiency anemia admitted on 11/7 with severe ulcerative pancolitis, and is currently stable with symptom relief, and is ready for discharge back to home today.  #UlcerativePancolitis Biopsy results indicate chronic moderately active colitis with no dysplasia or malignancy, and consistent with IBD. She currently denies nausea, vomiting, but endorses abdominal pain in LLQ when defacating, and has had 1-2 BM of formed stool.Per GI  note, long-term management has been discussed with patient and will require follow up as outpatient. She will be scheduled for a hospital follow up with IM outpatient center after discharge. -GIis following, recs are appreciated - Tolerating PO intake - Switched to Prednisone 40 mg PO daily (patient will need to taper as directed by GI, please refer to their note for details) - Zofran, phenergan PRN for nausea - Tylenol 650 mg q6h PRN for pain, (avoid NSAIDs) - Bentyl (dicyclomine)PRNfor intestinal spasms, discontinue after discharge   #Steroid Induced Hyperglycemia and Chronic Prediabetes A1c 5.7 on admission. No previous history of diabetes or diabetic medications. Today BMP glucose is 144. Patient will require insulin while on steroid treatment. She should follow up with IM outpatient clinic in one week for glucose check up and further optimization of insulin as necessary.  - Lantus 10 units qHS  #Hypoproliferative microcytic anemia CBC remains stable.Iron panel results indicative of iron deficiency anemia, however a normal Ferritin of 99 supports the idea of a co-existing inflammatory response.  - Patient received 2nd dose of IV Feraheme 510 mg in sodium chloride 0.9% 100 mL   LOS: 4 days   13/7, Medical Student 12/13/2019, 10:55 AM

## 2019-12-13 NOTE — Plan of Care (Cosign Needed)
Nutrition Education Note  RD consulted for nutrition education regarding ulcerative colitis. Spoke with patient, she is tolerating soft diet. PTA, she was consuming fresh fruits and vegetables, protein such as chicken and fish, and little to no dairy products. Patient states she enjoys fruit juices daily, does not regularly consume products containing caffeine and occasionally eats out.     RD provided "Inflammatory Bowel Disease (IBD): Crohn's Disease and Ulcerative Colitis Nutrition Therapy" handout from Nutrition Care Manual.   Discussed the importance of reducing fiber intake for proper GI healing. Discussed low fiber foods and ways to adjust favorite foods to reduce fiber amount. Spoke with patient about what foods are high and low in fiber. Also discussed the importance of food safety when choosing and preparing foods related to ulcerative colitis.   Used the teach back method for educating patient on low-fiber foods and the patient expresses understanding and acceptance of instructions.   Expect good compliance.   Current diet order is soft, patient is consuming approximately 100% of meals at this time. Labs and medications reviewed. No further nutrition interventions warranted at this time. RD contact information provided. If additional nutrition issues arise, please re-consult RD.   Placido Sou, Dietetic Intern Pager: [phone] If unavailable: [phone]

## 2019-12-15 ENCOUNTER — Encounter (HOSPITAL_COMMUNITY): Payer: Self-pay | Admitting: Gastroenterology

## 2019-12-15 LAB — THIOPURINE METHYLTRANSFERASE (TPMT), RBC: TPMT Activity:: 13.4 Units/mL RBC

## 2019-12-18 ENCOUNTER — Encounter: Payer: Self-pay | Admitting: Internal Medicine

## 2019-12-18 ENCOUNTER — Other Ambulatory Visit: Payer: Self-pay

## 2019-12-18 ENCOUNTER — Ambulatory Visit (INDEPENDENT_AMBULATORY_CARE_PROVIDER_SITE_OTHER): Payer: Self-pay | Admitting: Internal Medicine

## 2019-12-18 VITALS — BP 97/49 | HR 70 | Temp 98.3°F | Ht 59.0 in | Wt 132.5 lb

## 2019-12-18 DIAGNOSIS — R7303 Prediabetes: Secondary | ICD-10-CM

## 2019-12-18 DIAGNOSIS — K5100 Ulcerative (chronic) pancolitis without complications: Secondary | ICD-10-CM

## 2019-12-18 DIAGNOSIS — D509 Iron deficiency anemia, unspecified: Secondary | ICD-10-CM

## 2019-12-18 DIAGNOSIS — Z789 Other specified health status: Secondary | ICD-10-CM

## 2019-12-18 NOTE — Addendum Note (Signed)
Addended by: Jaci Standard on: 12/18/2019 04:25 PM   Modules accepted: Orders

## 2019-12-18 NOTE — Progress Notes (Signed)
   CC: Ulcerative colitis, pre-diabetes  HPI:  [name] is a 34 y.o. with a PMHx listed below presenting for a hospital follow up. She was newly diagnosed with ulcerative colitis during hospital admission. For details of today's visit and the status of his chronic medical issues please refer to the assessment and plan.   Past Medical History:  Diagnosis Date  . Anemia   . Hypokalemia 12/09/2019  . Hyponatremia 12/11/2019  . Pre-diabetes   . Ulcerative colitis (HCC)    Review of Systems:   Review of Systems  Constitutional: Negative for chills, fever, malaise/fatigue and weight loss.  Respiratory: Negative for cough, shortness of breath and wheezing.   Cardiovascular: Positive for leg swelling. Negative for chest pain.  Gastrointestinal: Negative for abdominal pain, blood in stool, constipation, diarrhea, nausea and vomiting.  Genitourinary: Negative for dysuria, frequency and urgency.  Neurological: Negative for weakness.     Physical Exam:  Vitals:   12/18/19 1318  BP: (!) 97/49  Pulse: 70  Temp: 98.3 F (36.8 C)  TempSrc: Oral  SpO2: 100%  Weight: 132 lb 8 oz (60.1 kg)  Height: 4\' 11"  (1.499 m)   Physical Exam Vitals reviewed.  Constitutional:      General: She is not in acute distress.    Appearance: Normal appearance. She is not ill-appearing.  Cardiovascular:     Rate and Rhythm: Normal rate and regular rhythm.     Pulses: Normal pulses.     Heart sounds: Normal heart sounds. No murmur heard.  No friction rub. No gallop.   Pulmonary:     Effort: Pulmonary effort is normal. No respiratory distress.     Breath sounds: Normal breath sounds. No wheezing or rales.  Abdominal:     General: Abdomen is flat. Bowel sounds are normal. There is no distension.     Palpations: Abdomen is soft.     Tenderness: There is no abdominal tenderness. There is no guarding.  Musculoskeletal:        General: No swelling.     Right lower leg: No edema.     Left lower  leg: No edema.  Skin:    General: Skin is warm and dry.  Neurological:     General: No focal deficit present.     Mental Status: She is alert and oriented to person, place, and time. Mental status is at baseline.  Psychiatric:        Mood and Affect: Mood normal.        Behavior: Behavior normal.        Thought Content: Thought content normal.        Judgment: Judgment normal.     Assessment & Plan:   See Encounters Tab for problem based charting.  Patient discussed with Dr. 

## 2019-12-18 NOTE — Assessment & Plan Note (Addendum)
Patient presents for hospital follow-up after being newly diagnosed with ulcerative colitis.  Patient was hospitalized from 11/7-11/12.  During her hospital stay colonoscopy was performed consistent with inflammatory bowel disease and biopsies were consistent with ulcerative colitis, severe on the left and moderate on the right.  Patient was started on a steroid taper.  She is to follow-up with GI outpatient in a few weeks and discuss therapy.  Initial plan is to start Imuran and transition to biologic agents.   Patient reports she has been doing well since hospital discharge.  Denies any abdominal pain, nausea or vomiting.  She states initially she had pain with bowel movements prior to admission and this has nearly resolved.  Patient is hesitant about starting therapy for UC.  Discussed why therapy is necessary and recommended.    Plan: -Continue steroid taper -GI outpatient follow-up in 3 weeks

## 2019-12-18 NOTE — Assessment & Plan Note (Signed)
Iron studies consistent with iron deficiency anemia. Ferritin up, likely due to inflammatory process. Received 2 doses IV Feraheme.  Recommended she continue daily oral iron supplementation.

## 2019-12-18 NOTE — Patient Instructions (Addendum)
[name],   It was a pleasure meeting you today. Today we discussed the following:   1. Ulcerative Colitis- Continue taking your steroid taper as prescribed. Make sure to follow up with your GI appointment to discuss therapy options and initiation.  2.   Pre-diabetes- Your blood sugar logs look good. You can stop taking insulin.  3.   Anemia- I recommend you continue taking daily iron supplements.     Please call the internal medicine center clinic if you have any questions or concerns, we may be able to help and keep you from a long and expensive emergency room wait. Our clinic and after hours phone number is [phone], the best time to call is Monday through Friday 9 am to 4 pm but there is always someone available 24/7 if you have an emergency. If you need medication refills please notify your pharmacy one week in advance and they will send [place] a request.   Thank you for allowing [place] to be a part of your care!     Ulcerative Colitis, Adult  Ulcerative colitis is long-lasting (chronic) inflammation of the large intestine (colon) and rectum. Sores (ulcers) may also form in these areas. Ulcerative colitis, along with a closely related condition called Crohn's disease, is often referred to as inflammatory bowel disease (IBD). What are the causes? This condition may be caused by increased activity of the immune system in the intestines. The immune system is the system that protects the body against harmful bacteria, viruses, fungi, and other things that can make you sick. The cause of the increased activity of the immune system is not known. What increases the risk? The following factors may make you more likely to develop this condition:  Being 12-36 years old. The risk is also increased for people who are 46-25 years old.  Having a family history of ulcerative colitis.  Being of Jewish descent. What are the signs or symptoms? Symptoms vary depending on how severe the condition is.  Common symptoms include:  Rectal bleeding.  Diarrhea, often with blood or pus in the stool. Other symptoms can include:  Pain or cramping in the abdomen.  Fever.  Fatigue.  Weight loss.  Night sweats.  Rectal pain.  A strong and sudden need to have a bowel movement (bowel urgency).  Nausea.  Loss of appetite.  Anemia.  Yellowing of the skin (jaundice) from liver dysfunction.  Joint pain or soreness.  Eye irritation.  Skin rashes. Symptoms can range from mild to severe. They may come and go. How is this diagnosed? This condition may be diagnosed based on:  Your symptoms and medical history.  A physical exam.  Tests, including: ? Blood tests and stool tests. ? X-ray. ? A CT scan. ? An MRI. ? Colonoscopy. For this test, a flexible tube is inserted into your anus, and your colon is examined. ? Biopsy. In this test, a tissue sample is taken from your colon and examined under a microscope. How is this treated? Treatment for this condition may include medicines to:  Decrease swelling and inflammation.  Control your immune system.  Treat infections.  Relieve pain.  Control diarrhea. Severe flare-ups may need to be treated at a hospital. Treatment in a hospital may involve:  Resting the bowel. This involves not eating or drinking for a period of time.  Getting medicines through an IV.  Getting fluids and nutrition through: ? An IV. ? A tube that is passed through the nose and into the stomach (  nasogastric tube, or NG tube).  Surgery to remove the affected part of the colon. This may be done if other treatments are not helping. This condition increases the risk of colon cancer. Adults with this condition will need to be watched for colon cancer throughout life. Follow these instructions at home: Medicines and vitamins  Take over-the-counter and prescription medicines only as told by your health care provider. Do not take aspirin.  If you were  prescribed an antibiotic medicine, take it as told by your health care provider. Do not stop taking the antibiotic even if you start to feel better.  Ask your health care provider if you should take any vitamins or supplements. You may need to take: ? Calcium and vitamin D for bone health. ? Iron to help treat anemia. Lifestyle  Exercise regularly.  Work with your health care provider to manage your condition and educate yourself about your condition.  Do not use any products that contain nicotine or tobacco, such as cigarettes, e-cigarettes, and chewing tobacco. If you need help quitting, ask your health care provider.  If you drink alcohol: ? Limit how much you use to:  0-1 drink a day for women.  0-2 drinks a day for men. ? Be aware of how much alcohol is in your drink. In the [place] one drink equals one 12 oz bottle of beer (355 mL), one 5 oz glass of wine (148 mL), or one 1 oz glass of hard liquor (44 mL). Eating and drinking  Drink enough fluid to keep your urine pale yellow.  Ask your health care provider about the best diet for you. Follow the diet as told by your health care provider. This may include: ? Avoiding carbonated drinks. ? Avoiding popcorn, vegetable skins, nuts, and other high-fiber foods. ? Avoiding high-fat foods. ? Eating smaller meals more often. ? Limiting your intake of sugary drinks. ? Limiting your caffeine intake.  Follow food safety recommendations as told by your health care provider. This may include making sure you: ? Avoid eating raw or undercooked meat, fish, or eggs. ? Do not eat or drink spoiled or expired foods and drinks.  Keep a food diary. This may help you identify and avoid any foods that trigger your symptoms. General instructions  Wash your hands often with soap and water. If soap and water are not available, use hand sanitizer.  Stay up to date on your vaccinations, including a yearly (annual) flu shot. Ask your health care  provider which vaccines you should get.  Follow recommendations from your health care provider for having cancer screening tests. Ulcerative colitis may place you at increased risk for colon cancer.  Keep all follow-up visits as told by your health care provider. This is important. Contact a health care provider if:  Your symptoms do not improve or they get worse with treatment.  You continue to lose weight.  You have constant cramps or loose stools.  You develop a new skin rash, skin sores, or eye problems.  You have a fever or chills. Get help right away if:  You have bloody diarrhea.  You have severe bleeding from the rectum.  You feel that your heart is racing (tachycardia).  You have severe pain in your abdomen.  Your abdomen swells (abdominal distension).  Your abdomen is tender to the touch.  You vomit. Summary  Ulcerative colitis is long-lasting (chronic) inflammation of the large intestine (colon) and rectum. Sores (ulcers) may also form in these areas.  Follow  instructions from your health care provider about medicines, lifestyle changes, and eating and drinking.  Contact your health care provider if symptoms do not improve or they get worse with treatment.  Get help right away if you have severe abdominal pain, abdominal swelling, or severe bleeding from the rectum.  Keep all follow-up visits as told by your health care provider. This is important. This information is not intended to replace advice given to you by your health care provider. Make sure you discuss any questions you have with your health care provider. Document Revised: 11/06/2017 Document Reviewed: 11/08/2017 Elsevier Patient Education  2020 [employer].

## 2019-12-18 NOTE — Assessment & Plan Note (Signed)
A1c 5.7 on hospital admission.  Blood sugars increased due to IV steroid use during hospital stay.  She was discharged to continue Lantus 10 units at bedtime to promote healing of GI ulcers.  Blood sugar logs show blood sugars all less than 180 and as low as 60s to 70s for fasting a.m. blood sugars.  Okay to discontinue Lantus at this time.  Plan: -Discontinue Lantus 10 units at bedtime

## 2019-12-20 NOTE — Progress Notes (Signed)
Internal Medicine Clinic Attending ° °Case discussed with Dr. Rehman  At the time of the visit.  We reviewed the resident’s history and exam and pertinent patient test results.  I agree with the assessment, diagnosis, and plan of care documented in the resident’s note.  ° °

## 2020-01-03 NOTE — Telephone Encounter (Signed)
Pt was seen on 11/17

## 2020-02-14 NOTE — Addendum Note (Signed)
Addended by: Dorie Rank E on: 02/14/2020 11:01 AM   Modules accepted: Orders

## 2020-12-02 ENCOUNTER — Emergency Department (HOSPITAL_COMMUNITY)
Admission: EM | Admit: 2020-12-02 | Discharge: 2020-12-02 | Disposition: A | Discharge status: Home / Self Care | Payer: 59 | Attending: Emergency Medicine | Admitting: Emergency Medicine

## 2020-12-02 ENCOUNTER — Encounter (HOSPITAL_COMMUNITY): Payer: Self-pay | Admitting: Emergency Medicine

## 2020-12-02 ENCOUNTER — Other Ambulatory Visit: Payer: Self-pay

## 2020-12-02 DIAGNOSIS — R197 Diarrhea, unspecified: Secondary | ICD-10-CM | POA: Diagnosis not present

## 2020-12-02 DIAGNOSIS — R0602 Shortness of breath: Secondary | ICD-10-CM | POA: Diagnosis not present

## 2020-12-02 DIAGNOSIS — R55 Syncope and collapse: Secondary | ICD-10-CM | POA: Insufficient documentation

## 2020-12-02 DIAGNOSIS — Z87891 Personal history of nicotine dependence: Secondary | ICD-10-CM | POA: Insufficient documentation

## 2020-12-02 DIAGNOSIS — O219 Vomiting of pregnancy, unspecified: Secondary | ICD-10-CM | POA: Insufficient documentation

## 2020-12-02 DIAGNOSIS — R112 Nausea with vomiting, unspecified: Secondary | ICD-10-CM

## 2020-12-02 DIAGNOSIS — Z3490 Encounter for supervision of normal pregnancy, unspecified, unspecified trimester: Secondary | ICD-10-CM

## 2020-12-02 DIAGNOSIS — R109 Unspecified abdominal pain: Secondary | ICD-10-CM | POA: Insufficient documentation

## 2020-12-02 DIAGNOSIS — R5383 Other fatigue: Secondary | ICD-10-CM | POA: Insufficient documentation

## 2020-12-02 DIAGNOSIS — Z3A00 Weeks of gestation of pregnancy not specified: Secondary | ICD-10-CM | POA: Insufficient documentation

## 2020-12-02 LAB — POC OCCULT BLOOD, ED: Fecal Occult Bld: NEGATIVE

## 2020-12-02 LAB — CBC WITH DIFFERENTIAL/PLATELET
Abs Immature Granulocytes: 0 10*3/uL (ref 0.00–0.07)
Basophils Absolute: 0.1 10*3/uL (ref 0.0–0.1)
Basophils Relative: 1 %
Eosinophils Absolute: 0.5 10*3/uL (ref 0.0–0.5)
Eosinophils Relative: 5 %
HCT: 30.1 % — ABNORMAL LOW (ref 36.0–46.0)
Hemoglobin: 10.3 g/dL — ABNORMAL LOW (ref 12.0–15.0)
Lymphocytes Relative: 26 %
Lymphs Abs: 2.7 10*3/uL (ref 0.7–4.0)
MCH: 22.9 pg — ABNORMAL LOW (ref 26.0–34.0)
MCHC: 34.2 g/dL (ref 30.0–36.0)
MCV: 66.9 fL — ABNORMAL LOW (ref 80.0–100.0)
Monocytes Absolute: 0.5 10*3/uL (ref 0.1–1.0)
Monocytes Relative: 5 %
Neutro Abs: 6.5 10*3/uL (ref 1.7–7.7)
Neutrophils Relative %: 63 %
Platelets: 615 10*3/uL — ABNORMAL HIGH (ref 150–400)
RBC: 4.5 MIL/uL (ref 3.87–5.11)
RDW: 14.8 % (ref 11.5–15.5)
WBC: 10.3 10*3/uL (ref 4.0–10.5)
nRBC: 0 % (ref 0.0–0.2)
nRBC: 0 /100 WBC

## 2020-12-02 LAB — URINALYSIS, ROUTINE W REFLEX MICROSCOPIC
Bilirubin Urine: NEGATIVE
Glucose, UA: NEGATIVE mg/dL
Hgb urine dipstick: NEGATIVE
Ketones, ur: 20 mg/dL — AB
Leukocytes,Ua: NEGATIVE
Nitrite: NEGATIVE
Protein, ur: NEGATIVE mg/dL
Specific Gravity, Urine: 1.019 (ref 1.005–1.030)
pH: 6 (ref 5.0–8.0)

## 2020-12-02 LAB — COMPREHENSIVE METABOLIC PANEL
ALT: 9 U/L (ref 0–44)
AST: 13 U/L — ABNORMAL LOW (ref 15–41)
Albumin: 2.3 g/dL — ABNORMAL LOW (ref 3.5–5.0)
Alkaline Phosphatase: 80 U/L (ref 38–126)
Anion gap: 7 (ref 5–15)
BUN: 10 mg/dL (ref 6–20)
CO2: 24 mmol/L (ref 22–32)
Calcium: 8.5 mg/dL — ABNORMAL LOW (ref 8.9–10.3)
Chloride: 102 mmol/L (ref 98–111)
Creatinine, Ser: 0.72 mg/dL (ref 0.44–1.00)
GFR, Estimated: >60 mL/min (ref >60)
Glucose, Bld: 92 mg/dL (ref 70–99)
Potassium: 3.5 mmol/L (ref 3.5–5.1)
Sodium: 133 mmol/L — ABNORMAL LOW (ref 135–145)
Total Bilirubin: 0.4 mg/dL (ref 0.3–1.2)
Total Protein: 7 g/dL (ref 6.5–8.1)

## 2020-12-02 LAB — HCG, QUANTITATIVE, PREGNANCY: hCG, Beta Chain, Quant, S: 83414 m[IU]/mL — ABNORMAL HIGH (ref <5)

## 2020-12-02 LAB — I-STAT BETA HCG BLOOD, ED (MC, WL, AP ONLY): I-stat hCG, quantitative: >2000 m[IU]/mL — ABNORMAL HIGH (ref <5)

## 2020-12-02 LAB — SAMPLE TO BLOOD BANK

## 2020-12-02 LAB — LIPASE, BLOOD: Lipase: 28 U/L (ref 11–51)

## 2020-12-02 LAB — TROPONIN I (HIGH SENSITIVITY): Troponin I (High Sensitivity): 7 ng/L (ref <18)

## 2020-12-02 MED ORDER — ONDANSETRON 4 MG PO TBDP: 4.0000 mg | ORAL_TABLET | Freq: Three times a day (TID) | ORAL | 0 refills | Status: DC | PRN

## 2020-12-02 MED ORDER — SODIUM CHLORIDE 0.9 % IV BOLUS
1000.0000 mL | Freq: Once | INTRAVENOUS | Status: AC
  Administered 2020-12-02: 1000 mL via INTRAVENOUS

## 2020-12-02 MED ORDER — ONDANSETRON HCL 4 MG/2ML IJ SOLN
4.0000 mg | Freq: Once | INTRAMUSCULAR | Status: AC
  Administered 2020-12-02: 4 mg via INTRAVENOUS
  Filled 2020-12-02: qty 2

## 2020-12-02 NOTE — ED Provider Notes (Signed)
[facility] EMERGENCY DEPARTMENT Provider Note   CSN: [id number] Arrival date & time: 12/02/20  1243     History Chief Complaint  Patient presents with   Abdominal Pain    [name] is a 35 y.o. female who presents to the ED today complaining of a syncopal episode on Saturday and bloody stools. Patient states that she was diagnosed with UC in November of 2021 and saw a GI specialist one time but did not agree with the treatment plan of receiving infusions and did not follow up. Patient states she has been attempting to control UC with diet and OTC anti-diarrheal medication. Patient states that she usually  has bloody bowel movements 3 to 4 times per week and that is not out of the ordinary for her. Patient states that on Saturday she was "out and about" with her friends when she suddenly became lightheaded and "passed out". Patient states she blacked out and came to on the floor. Patient denies shortness of breath, chest pain prior to syncopal episode. Patient states she has not been able to keep food or water down for the past 10 days and has "vomited 3 or 4 times per day" for the last 10 days. Per testing patient is pregnant and her nausea and vomiting is similar to her previous pregnancy.    Abdominal Pain Associated symptoms: diarrhea, fatigue, nausea, shortness of breath and vomiting   Associated symptoms: no chest pain, no fever and no hematuria       Past Medical History:  Diagnosis Date   Anemia    Hypokalemia 12/09/2019   Hyponatremia 12/11/2019   Pre-diabetes    Ulcerative colitis (HCC)     Patient Active Problem List   Diagnosis Date Noted   Prediabetes 12/18/2019   Hypoalbuminemia 12/09/2019   Microcytic anemia 12/09/2019   Ulcerative pancolitis (HCC) 12/08/2019   ACNE Dennis/[birth year]2008    Past Surgical History:  Procedure Laterality Date   BIOPSY  12/11/2019   Procedure: BIOPSY;  Surgeon: Brahmbhatt, Parag, MD;  Location: [facility] ENDOSCOPY;  Service:  Gastroenterology;;   COLONOSCOPY N/A 12/11/2019   Procedure: COLONOSCOPY;  Surgeon: Brahmbhatt, Parag, MD;  Location: [facility] ENDOSCOPY;  Service: Gastroenterology;  Laterality: N/A;   dilation and curetage     NO PAST SURGERIES       OB History     Gravida  3   Para  1   Term  1   Preterm  0   AB  2   Living  1      SAB  1   IAB  1   Ectopic  0   Multiple  0   Live Births              Family History  Problem Relation Age of Onset   Heart disease Mother    Hypertension Father    Diabetes Father    Diabetes Maternal Grandmother    Diabetes Paternal Grandfather     Social History   Tobacco Use   Smoking status: Former   Smokeless tobacco: Never  Substance Use Topics   Alcohol use: Not Currently   Drug use: No    Home Medications Prior to Admission medications   Medication Sig Start Date End Date Taking? Authorizing Provider  blood glucose meter kit and supplies Dispense based on patient and insurance preference. Use up to four times daily as directed. (FOR ICD-10 E10.9, E11.9). 12/13/19   Aslam, Sadia, MD  Blood Glucose Monitoring Suppl (TRUE   METRIX METER) w/Device KIT USE AS DIRECTED 12/13/19 12/12/20  Aslam, Sadia, MD  glucose blood test strip USE AS DIRECTED FOUR TIMES DAILY 12/13/19 12/12/20  Aslam, Sadia, MD  Insulin Syringe-Needle U-100 30G X 1/2" 0.3 ML MISC USE AS DIRECTED WITH INSULIN 12/13/19 12/12/20  Aslam, Sadia, MD  ondansetron (ZOFRAN) 4 MG tablet TAKE 1 TABLET (4 MG TOTAL) BY MOUTH EVERY SIX HOURS AS NEEDED FOR NAUSEA. 12/13/19 12/12/20  Aslam, Sadia, MD  predniSONE (DELTASONE) 10 MG tablet TAKE 4 TABS BY MOUTH DAILY ON 11/13-11/19, 3 TABS DAILY ON 11/20-11/26, 2 TABS DAILY ON 11/27-12/3, AND 1 TAB DAILY ON 12/3-12/10 12/13/19 12/12/20  Aslam, Sadia, MD  predniSONE (STERAPRED UNI-PAK 21 TAB) 10 MG (21) TBPK tablet Take 40mg daily 11/13-11/19, 30mg daily 11/20-11/26, 20mg daily 11/27-12/3, and 10mg daily 12/3-12/10 12/13/19   Aslam, Sadia, MD   TRUEplus Lancets 28G MISC USE AS DIRECTED 12/13/19 12/12/20  Aslam, Sadia, MD  insulin glargine (LANTUS) 100 UNIT/ML injection Inject 0.1 mLs (10 Units total) into the skin at bedtime. 12/13/19 12/18/19  Aslam, Sadia, MD    Allergies    Patient has no known allergies.  Review of Systems   Review of Systems  Constitutional:  Positive for appetite change and fatigue. Negative for fever.  Respiratory:  Positive for chest tightness and shortness of breath.   Cardiovascular:  Negative for chest pain and palpitations.  Gastrointestinal:  Positive for abdominal pain, blood in stool, diarrhea, nausea and vomiting.  Genitourinary:  Negative for difficulty urinating and hematuria.  Neurological:  Positive for syncope and weakness.   Physical Exam Updated Vital Signs BP (!) 124/105 (BP Location: Right Arm)   Pulse 91   Temp 98.7 F (37.1 C) (Oral)   Resp 20   SpO2 100%   Physical Exam Exam conducted with a chaperone present.  Constitutional:      General: She is not in acute distress.    Appearance: She is not ill-appearing.  Cardiovascular:     Rate and Rhythm: Normal rate and regular rhythm.  Pulmonary:     Effort: Pulmonary effort is normal.     Breath sounds: Normal breath sounds.  Abdominal:     General: Abdomen is flat. Bowel sounds are normal.     Tenderness: There is no abdominal tenderness.  Genitourinary:    Rectum: Normal. No mass.  Skin:    General: Skin is warm and dry.  Neurological:     Mental Status: She is alert.    ED Results / Procedures / Treatments   Labs (all labs ordered are listed, but only abnormal results are displayed) Labs Reviewed  COMPREHENSIVE METABOLIC PANEL - Abnormal; Notable for the following components:      Result Value   Sodium 133 (*)    Calcium 8.5 (*)    Albumin 2.3 (*)    AST 13 (*)    All other components within normal limits  CBC WITH DIFFERENTIAL/PLATELET - Abnormal; Notable for the following components:   Hemoglobin 10.3  (*)    HCT 30.1 (*)    MCV 66.9 (*)    MCH 22.9 (*)    Platelets 615 (*)    All other components within normal limits  I-STAT BETA HCG BLOOD, ED ([facility], [facility], [facility] ONLY) - Abnormal; Notable for the following components:   I-stat hCG, quantitative >2,000.0 (*)    All other components within normal limits  LIPASE, BLOOD  URINALYSIS, ROUTINE W REFLEX MICROSCOPIC  HCG, QUANTITATIVE, PREGNANCY  OCCULT BLOOD X 1   CARD TO LAB, STOOL  SAMPLE TO BLOOD BANK  TROPONIN I (HIGH SENSITIVITY)    EKG None  Radiology No results found.  Procedures Procedures   Medications Ordered in ED Medications  sodium chloride 0.9 % bolus 1,000 mL (has no administration in time range)  ondansetron (ZOFRAN) injection 4 mg (has no administration in time range)    ED Course  I have reviewed the triage vital signs and the nursing notes.  Pertinent labs & imaging results that were available during my care of the patient were reviewed by me and considered in my medical decision making (see chart for details).  Clinical Course as of 11/Dennis/22 Dennis  [name], Amanda  1723 This is a 35 year old female with a history of ulcerative colitis, not on active treatment, presenting to emergency department with nausea, vomiting, and painful defecation for about 3 days.  She reports she has been nauseous and vomiting for "some time now".  She reports her last menstrual period was approximately 5 weeks ago at the end of September.  Her work-up today shows that she is in fact pregnant.  She was not aware of this diagnosis, but reports that she is having morning sickness similar to her prior pregnancy, 13 years ago, which time she had significant morning sickness.  She denies fevers or chills.  She reports she had a syncope or near syncopal episode 3 days ago after several episodes of vomiting.  No other significant medical problems.  She is to follow-up with [facility] gastroenterology for her ulcerative colitis, but had refused infusion  treatments for them, preferring the holistic approach, however at this time she reports she will be interested in seeing them again and may be more amenable to medical management of her ulcerative colitis.  She denies any vaginal bleeding or spotting.  She denies any cramping abdominal pain.  On exam the patient appears extremely comfortable.  She has no focal abdominal tenderness.  She is afebrile.  Blood counts show normal white blood cell count.  Hemoglobin is slightly above baseline at 10.3.  She has a microcytic anemia which is chronic.  CMP is overall stable, chronic low albumin levels.  I have a low suspicion for ectopic pregnancy or inevitable abortion at this time.  We did discuss follow-up with an OB/GYN for her early pregnancy (she has not ready to determine whether she would keep this pregnancy or not), and conservative management for the ulcerative colitis.  I do not suspect this is severe flareup without leukocytosis or significant abdominal tenderness.  She reports she has chronic diarrhea all the time, sometimes it is bloody lasting a few days and then resolves.  I would encourage her to follow-up GI [MT]  1726 Here we will give her a liter fluid as well as some Zofran and p.o. challenge her.  If she is able to keep down her fluids anticipate she can be discharged with outpatient follow-up.  She is agreeable to this plan.   [MT]    Clinical Course User Index [MT] Wyvonnia Dusky, MD   MDM Rules/Calculators/A&P                          35YOF with history of UC not on an active treatment plan presents to ED for chief complaint of syncopal episode Saturday, frequent bloody stools, nausea and vomiting. Patient states she has been unable to ingest fluids or solids for last 10 days and has been vomiting "3  to 4 times per day" for the last 10 days. Patient denies chest pain, shortness of breath or history of seizures. Patient denies frequent syncopal episodes. Doubt anything neurologic or  cardiac. Patient states her LMP was 9/25 and according to testing here at hospital patient is beta hCG positive. Patient states with her last pregnancy 13 years ago she also had frequent nausea and vomiting. Patient denies fever or chills but does endorse weakness, fatigue, chest pressure. Patient will be given instructions to follow up with [facility] for her UC as well as OBGYN for positive pregnancy results. Patient denies vaginal spotting, cramping or abdominal pain.    Final Clinical Impression(s) / ED Diagnoses Final diagnoses:  None    Rx / DC Orders ED Discharge Orders     None        ,  F, PA 11/Dennis/22 1835    Trifan, Matthew J, MD 12/03/20 1003  

## 2020-12-02 NOTE — ED Triage Notes (Signed)
Patient with history of ulcerative colitis complains of bright red blood mixed with stool and pain with bowel movements that started a few days ago. Patient alert, oriented, and in no apparent distress at this time.

## 2020-12-02 NOTE — ED Provider Notes (Signed)
Emergency Medicine Provider Triage Evaluation Note  [name] , a 35 y.o. female  was evaluated in triage.  Pt complains of bright red blood in her stool with bowel movements.  This started a few days ago.  She reports nausea with out vomiting.  She denies fevers.    She passed out two days ago.  She was "out and about" and got hot.  She had a full syncopal event. Denies any injuries during  the fall.     Review of Systems  Positive: N/V/D, Abdominal pain, blood in bowel movements, syncope Negative: Fevers  Physical Exam  BP 102/63 (BP Location: Right Arm)   Pulse (!) 106   Temp 98.7 F (37.1 C)   Resp 16   SpO2 98%  Gen:   Awake, no distress   Resp:  Normal effort  MSK:   Moves extremities without difficulty  Other:  Normal speech, slightly tachycardic.  Abd soft, minimal TTP upper abdomen, non distended.   Medical Decision Making  Medically screening exam initiated at 1:19 PM.  Appropriate orders placed.  [name] was informed that the remainder of the evaluation will be completed by another provider, this initial triage assessment does not replace that evaluation, and the importance of remaining in the ED until their evaluation is complete.  Patient with syncope.  Will obtain labs.    Cristina Gong, PA-C 12/02/20 1325    Pricilla Loveless, MD 12/02/20 1622

## 2020-12-02 NOTE — Discharge Instructions (Addendum)
Patient should follow up with [facility] for UC Patient should follow up with OBGYN outpatient  Patient should return to ED with any new or worsening symptoms Patient should remain hydrated and attempt to ingest fluids regularly

## 2020-12-03 ENCOUNTER — Other Ambulatory Visit: Payer: Self-pay

## 2020-12-09 ENCOUNTER — Encounter (HOSPITAL_COMMUNITY): Payer: Self-pay | Admitting: Emergency Medicine

## 2020-12-09 ENCOUNTER — Inpatient Hospital Stay (HOSPITAL_COMMUNITY): Payer: 59

## 2020-12-09 ENCOUNTER — Inpatient Hospital Stay (HOSPITAL_COMMUNITY)
Admission: AD | Admit: 2020-12-09 | Discharge: 2020-12-09 | Disposition: A | Discharge status: Home / Self Care | Payer: 59 | Attending: Obstetrics and Gynecology | Admitting: Obstetrics and Gynecology

## 2020-12-09 DIAGNOSIS — Z3A01 Less than 8 weeks gestation of pregnancy: Secondary | ICD-10-CM | POA: Diagnosis not present

## 2020-12-09 DIAGNOSIS — R1013 Epigastric pain: Secondary | ICD-10-CM | POA: Diagnosis not present

## 2020-12-09 DIAGNOSIS — K51011 Ulcerative (chronic) pancolitis with rectal bleeding: Secondary | ICD-10-CM | POA: Diagnosis not present

## 2020-12-09 DIAGNOSIS — Z87891 Personal history of nicotine dependence: Secondary | ICD-10-CM | POA: Diagnosis not present

## 2020-12-09 DIAGNOSIS — O210 Mild hyperemesis gravidarum: Secondary | ICD-10-CM | POA: Insufficient documentation

## 2020-12-09 DIAGNOSIS — R112 Nausea with vomiting, unspecified: Secondary | ICD-10-CM

## 2020-12-09 DIAGNOSIS — O26891 Other specified pregnancy related conditions, first trimester: Secondary | ICD-10-CM | POA: Insufficient documentation

## 2020-12-09 DIAGNOSIS — O219 Vomiting of pregnancy, unspecified: Secondary | ICD-10-CM | POA: Diagnosis not present

## 2020-12-09 DIAGNOSIS — O3680X0 Pregnancy with inconclusive fetal viability, not applicable or unspecified: Secondary | ICD-10-CM

## 2020-12-09 LAB — SEDIMENTATION RATE: Sed Rate: 64 mm/hr — ABNORMAL HIGH (ref 0–22)

## 2020-12-09 LAB — HEPATITIS B SURFACE ANTIGEN: Hepatitis B Surface Ag: NONREACTIVE

## 2020-12-09 LAB — C-REACTIVE PROTEIN: CRP: 4.8 mg/dL — ABNORMAL HIGH (ref <1.0)

## 2020-12-09 MED ORDER — PREDNISONE 20 MG PO TABS
40.0000 mg | ORAL_TABLET | Freq: Every day | ORAL | Status: DC
  Administered 2020-12-09: 40 mg via ORAL
  Filled 2020-12-09 (×3): qty 2

## 2020-12-09 MED ORDER — ONDANSETRON 4 MG PO TBDP
4.0000 mg | ORAL_TABLET | Freq: Once | ORAL | Status: AC | PRN
  Administered 2020-12-09: 4 mg via ORAL
  Filled 2020-12-09: qty 1

## 2020-12-09 MED ORDER — LOPERAMIDE HCL 2 MG PO CAPS
4.0000 mg | ORAL_CAPSULE | Freq: Once | ORAL | Status: AC
  Administered 2020-12-09: 4 mg via ORAL
  Filled 2020-12-09: qty 2

## 2020-12-09 MED ORDER — FAMOTIDINE IN NACL 20-0.9 MG/50ML-% IV SOLN
20.0000 mg | Freq: Once | INTRAVENOUS | Status: AC
  Administered 2020-12-09: 20 mg via INTRAVENOUS
  Filled 2020-12-09: qty 50

## 2020-12-09 MED ORDER — METOCLOPRAMIDE HCL 10 MG PO TABS: 10.0000 mg | ORAL_TABLET | Freq: Four times a day (QID) | ORAL | 0 refills | Status: DC

## 2020-12-09 MED ORDER — LACTATED RINGERS IV BOLUS
1000.0000 mL | Freq: Once | INTRAVENOUS | Status: AC
  Administered 2020-12-09: 1000 mL via INTRAVENOUS

## 2020-12-09 MED ORDER — PREDNISONE 10 MG PO TABS: 40.0000 mg | ORAL_TABLET | Freq: Every day | ORAL | 0 refills | Status: DC

## 2020-12-09 MED ORDER — METOCLOPRAMIDE HCL 5 MG/ML IJ SOLN
10.0000 mg | Freq: Once | INTRAMUSCULAR | Status: AC
  Administered 2020-12-09: 10 mg via INTRAVENOUS
  Filled 2020-12-09: qty 2

## 2020-12-09 NOTE — Consult Note (Addendum)
Attending physician's note   I have taken an interval history, reviewed the chart and examined the patient. I agree with the Advanced Practitioner's note, impression, and recommendations as outlined.   Patient is a 35 year old female with a history of Ulcerative Pancolitis diagnosed during hospital admission in 12/2019.  Was treated with prednisone and she reports having clinical improvement during the 4 weeks that she took this medication.  She then failed to follow-up with Dr. Alessandra Bevels at [place].  Since then has tried "herbal remedies", but continues to have watery, bloody stools.  Symptoms exacerbating over the last 3 weeks or so with associated nausea with nonbloody emesis.  Now having BM hourly, urgency, and multiple nocturnal stools.  Prior to 12/2019, no prior GI history, history of IBD, etc.  No known family history of Crohn's, ulcerative colitis, GI malignancy.  No previous EGD.  Evaluation in 12/2019 notable for severe colitis on colonoscopy as outlined below.  Otherwise normal/negative QuantiFERON gold.  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 HBsAg-.  Was discharged 12/13/2019 with H/H 9.4/27.8, PLT 638.  Albumin was 1.4 with otherwise normal liver enzymes.  No labs for review in the interim until ER evaluation in 12/02/2020 as below:  - H/H 10.3/30 with MCV/RDW 67/14.8 - WBC 10.3, PLT 615 - Sodium 133 otherwise normal BMP - Albumin 2.3 otherwise normal liver enzymes - Normal lipase - hCG positive  Endoscopic History: - Colonoscopy (12/2019, Dr. 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  Normal TI  1) Ulcerative Colitis 2) Diarrhea 3) Fecal urgency 4) Hematochezia 5) Nausea/Vomiting 6) Hypoalbuminemia - Clinical presentation all consistent with continued, smoldering Ulcerative Colitis over the last year with  exacerbation over the last 3 weeks or so.  Discussed risks of uncontrolled IBD at length with the patient today, to include increased risks during pregnancy.  She wishes to proceed with more aggressive medical management. - Discussed medication options at length with the patient today.  Briefly discussed the role of Biologics (i.e. Cimzia), 5-ASA therapy, steroids, etc.. We had a long, candid conversation of risks of these medications, to include risks during pregnancy, and she ultimately would like to proceed with steroids - Start prednisone 40 mg/day x2 weeks with plan for slow taper - Not a great candidate for immunomodulators based on intermediate TPMT levels - Based on previous Mayo 3 severe colitis, do not feel that 5-ASA monotherapy is a good option.  Could potentially add 5-ASA depending on response to steroids - Repeat QuantiFERON gold - Check GI PCR panel - Check ESR, CRP, fecal calprotectin - Will plan for follow-up in the GI clinic - Antiemetics per OB service  7) Microcytic anemia - Suspect iron deficiency from underlying UC - Check iron panel - Start on oral iron therapy  8) Pregnancy - Management per OB  9) Gestational diabetes - Close follow-up in the OB service, particularly as we initiate steroids  Patient would very much like to discharge home today.  No strong need to remain inpatient from a GI standpoint.  I strongly recommended that she keep follow-up as an outpatient.   [address], DO, FACG [phone] office        [facility] Consult: 3:14 PM 12/09/2020  LOS: 0 days    Referring Provider: Dr Ilda Basset  Primary Care Physician:  Pa, [place] Primary Gastroenterologist:  Althia Forts     Reason for Consultation: Symptomatic UC.   HPI: [name]  is a 35 y.o. female.  G4 P1021.  Estimated current gestation of 6+ weeks by dates.  DM 2/(gestational?) diabetes previously treated with insulin. 12/2019 inpatient  colonoscopy, inpatient GI eval for bloody diarrhea, abdominal pain.  Hb 9.6, MCV 72.  CT shows pancolitis poor prep.  Study to ileum.  Mayo score 3 continuous circumferential pancolitis.  Ileum normal.  Pathology confirmed moderately active colitis in the right colon and severe colitis in the left colon She was treated with IV Solu-Medrol but discharged home on prednisone 40 mg with taper over 3 weeks to 10 mg.  Hep B surface antigen was nonreactive.  TPMT activity 13.4.  QuantiFERON gold "indeterminate" No follow-up with GI.  Patient opted for "holistic approach" and was not interested in infusion therapies/Biologics.  Symptoms were a little bit better for the month she took prednisone.  Also used herbs for a while but stopped these.  She has had chronic loose, sometimes bloody stools since then.  About 3 weeks ago diarrhea took a turn for the worse, having hourly, sometimes every 30 minute episodes, often bloody or rusty colored.  Loose as well as watery.  On the rare occasion she has solid stool she has rectal pain.  Epigastric pain.  Nausea.  12/02/2020 ED visit for nausea, vomiting, painful defecation for about 3 days though nausea more longstanding.  Pregnancy testing positive, new information for patient PO she had last menstrual period 5 weeks prior.  Hgb 10.3.  MCV 66.9.  Na 133.  Glucose 144.  Presented to the maternity acute care unit complaining of ongoing symptoms.  Unable to tolerate p.o., prescription for Zofran given at recent ED visit is not helping.  Persistent epigastric burning/pressure-like pain.  Some chills, no sweats.  At this point she is done more research on ulcerative colitis and realizes that she is going to need more medical therapies than she had thought and is willing to pursue biologic therapy.  Has not been drinking alcohol for several weeks since the onset of the symptoms.    Family history negative for inflammatory bowel disease. Lives in [place] with 57 year old son.  Does  not live with a partner.    Past Medical History:  Diagnosis Date   Anemia    Hypokalemia 12/09/2019   Hyponatremia 12/11/2019   Pre-diabetes    Ulcerative colitis [facility])     Past Surgical History:  Procedure Laterality Date   BIOPSY  12/11/2019   Procedure: BIOPSY;  Surgeon: Otis Brace, MD;  Location: [facility];  Service: Gastroenterology;;   COLONOSCOPY N/A 12/11/2019   Procedure: COLONOSCOPY;  Surgeon: Otis Brace, MD;  Location: [facility];  Service: Gastroenterology;  Laterality: N/A;   dilation and curetage     NO PAST SURGERIES      Prior to Admission medications   Medication Sig Start Date End Date Taking? Authorizing Provider  ondansetron (ZOFRAN ODT) 4 MG disintegrating tablet Take 1 tablet (4 mg total) by mouth every 8 (eight) hours as needed for nausea or vomiting. 12/02/20  Yes Azucena Cecil, PA  blood glucose meter kit and supplies Dispense based on patient and insurance preference. Use up to four times daily as directed. (FOR ICD-10 E10.9, E11.9). 12/13/19   Harvie Heck, MD  Blood Glucose Monitoring Suppl (TRUE METRIX METER) w/Device KIT USE AS DIRECTED 12/13/19 12/12/20  Harvie Heck, MD  glucose blood test strip USE AS DIRECTED FOUR TIMES DAILY 12/13/19 12/12/20  Harvie Heck, MD  Insulin Syringe-Needle U-100 30G X 1/2" 0.3 ML MISC  USE AS DIRECTED WITH INSULIN 12/13/19 12/12/20  Harvie Heck, MD  loperamide (IMODIUM A-D) 2 MG tablet Take 2 mg by mouth as needed for diarrhea or loose stools.    [provider]  predniSONE (DELTASONE) 10 MG tablet TAKE 4 TABS BY MOUTH DAILY ON 11/13-11/19, 3 TABS DAILY ON 11/20-11/26, 2 TABS DAILY ON 11/27-12/3, AND 1 TAB DAILY ON 12/3-12/10 Patient not taking: No sig reported 12/13/19 12/12/20  Harvie Heck, MD  predniSONE (STERAPRED UNI-PAK 21 TAB) 10 MG (21) TBPK tablet Take 35m daily 11/13-11/19, 342mdaily 11/20-11/26, 2021maily 11/27-12/3, and 11m47mily 12/3-12/10 Patient not taking: No sig  reported 12/13/19   AslaHarvie Heck  Probiotic Product (PROBIOTIC BLEND) CAPS Take 1-2 capsules by mouth daily.    [provider]  TRUEplus Lancets 28G MISC USE AS DIRECTED 12/13/19 12/12/20  AslaHarvie Heck  insulin glargine (LANTUS) 100 UNIT/ML injection Inject 0.1 mLs (10 Units total) into the skin at bedtime. 12/13/19 12/18/19  AslaHarvie Heck    Scheduled Meds:  Infusions:  PRN Meds:    Allergies as of 12/09/2020   (No Known Allergies)    Family History  Problem Relation Age of Onset   Heart disease Mother    Hypertension Father    Diabetes Father    Diabetes Maternal Grandmother    Diabetes Paternal Grandfather     Social History   Socioeconomic History   Marital status: Single    Spouse name: Not on file   Number of children: Not on file   Years of education: Not on file   Highest education level: Not on file  Occupational History   Not on file  Tobacco Use   Smoking status: Former   Smokeless tobacco: Never  Substance and Sexual Activity   Alcohol use: Not Currently   Drug use: No   Sexual activity: Yes    Birth control/protection: Injection  Other Topics Concern   Not on file  Social History Narrative   Not on file   Social Determinants of Health   Financial Resource Strain: Not on file  Food Insecurity: Not on file  Transportation Needs: Not on file  Physical Activity: Not on file  Stress: Not on file  Social Connections: Not on file  Intimate Partner Violence: Not on file    REVIEW OF SYSTEMS: Constitutional: Some weakness, not profound. ENT:  No nose bleeds Pulm: No shortness of breath, no cough. CV:  No palpitations, no LE edema.  No angina GU:  No hematuria, no frequency GI: See HPI. Heme: No unusual bleeding or bruising.  Not taking iron.  Not see prenatal vitamin on her med list. Transfusions: None. Neuro:  No headaches, no peripheral tingling or numbness no syncope.  No seizures. Derm:  No itching, no rash or sores.   Endocrine:  No sweats or chills.  No polyuria or dysuria Immunization: Reviewed chart, no vaccinations documented. Travel:  Not queried   PHYSICAL EXAM: Vital signs in last 24 hours: Vitals:   12/09/20 1141 12/09/20 1147  BP: 126/74   Pulse: (!) 123 (!) 105  Resp: 18   Temp: 98 F (36.7 C)   SpO2: 100%    Wt Readings from Last 3 Encounters:  12/18/19 60.1 kg  12/10/19 61.1 kg  09/19/12 67 kg    General: Pleasant, comfortable, does not look acutely ill. Head:  No facial asymmetry or swelling.  No signs of head trauma. Eyes: No conjunctival pallor Ears: Not hard of hearing Nose: No  congestion or discharge Mouth: Pink, moist, clear mucosa. Neck: No JVD Lungs: Clear bilaterally.  No labored breathing or cough Heart: Regular, tachycardic. Abdomen: Soft, not tender.  Not distended.  Active bowel sounds.  No hernias, masses, bruits..   Rectal: Deferred. Musc/Skeltl: No joint redness, swelling or gross deformity. Extremities: No CCE. Neurologic: Fully alert and oriented.  Good historian.  Moves all 4 limbs without gross deficits or weakness but strength not tested.  No tremors. Skin: No obvious rashes, sores or suspicious lesions.   Psych: Pleasant, cooperative.  Mood speech.  Intake/Output from previous day: No intake/output data recorded. Intake/Output this shift: No intake/output data recorded.  LAB RESULTS: No results for input(s): WBC, HGB, HCT, PLT in the last 72 hours. BMET Lab Results  Component Value Date   NA 133 (L) 12/02/2020   NA 135 12/13/2019   NA 134 (L) 12/12/2019   K 3.5 12/02/2020   K 4.2 12/13/2019   K 4.1 12/12/2019   CL 102 12/02/2020   CL 104 12/13/2019   CL 104 12/12/2019   CO2 24 12/02/2020   CO2 25 12/13/2019   CO2 23 12/12/2019   GLUCOSE 92 12/02/2020   GLUCOSE 144 (H) 12/13/2019   GLUCOSE 207 (H) 12/12/2019   BUN 10 12/02/2020   BUN 7 12/13/2019   BUN <5 (L) 12/12/2019   CREATININE 0.72 12/02/2020   CREATININE 0.71 12/13/2019    CREATININE 0.71 12/12/2019   CALCIUM 8.5 (L) 12/02/2020   CALCIUM 8.2 (L) 12/13/2019   CALCIUM 8.0 (L) 12/12/2019   LFT No results for input(s): PROT, ALBUMIN, AST, ALT, ALKPHOS, BILITOT, BILIDIR, IBILI in the last 72 hours. PT/INR Lab Results  Component Value Date   INR 1.4 (H) 12/09/2019   INR 1.2 12/08/2019   Hepatitis Panel No results for input(s): HEPBSAG, HCVAB, HEPAIGM, HEPBIGM in the last 72 hours. C-Diff No components found for: CDIFF Lipase     Component Value Date/Time   LIPASE 28 12/02/2020 1331    Drugs of Abuse  No results found for: LABOPIA, COCAINSCRNUR, LABBENZ, AMPHETMU, THCU, LABBARB   RADIOLOGY STUDIES: No results found.   IMPRESSION:   Ulcerative colitis.  Currently with symptoms suggestive of flare though need to rule out infectious diarrhea.  Diagnosis by colonoscopy 1 year ago.  Treated briefly with prednisone, opted not to pursue more advanced biologic therapies.  6+ weeks pregnancy by dates.  Macrocytic anemia.  Hb 10.3 currently but may reflect some degree of volume contraction given many days of nausea, vomiting and frequent diarrhea.  It was 9.41-year ago.  MCV currently 66.9, lower than 1 year ago.    PLAN:       Stool path PCR, fecal calprotectin.   ESR, CRP, Quantiferon gold, Hep B surf Ag:  all these were ordered    Prednisone 40 mg daily x 2 weeks, taper by 5 mg decrements every week thereafter.  Obviously going to need to keep watch on blood glucose in setting steroids and hx gestational vs steroid related DM and past use of insulin.      Dr De [place]ingshead office will contact pt w dates for outpt follow up.     Azucena Freed  12/09/2020, 3:14 PM Phone [phone]

## 2020-12-09 NOTE — ED Provider Notes (Signed)
Emergency Medicine Provider OB Triage Evaluation Note  [name] is a 35 y.o. female, [id number], at Unknown gestation who presents to the emergency department with complaints of abdominal pain, nausea, vomiting, and diarrhea that has been present for a few weeks.  Patient has a history of ulcerative colitis.  She endorses numerous episodes of nonbloody, nonbilious emesis.  Her typical bloody stool from ulcerative colitis.  No fever or chills. She also endorses fatigue. Denies vaginal bleeding.  Patient's last menstrual cycle was on 9/25.  Positive hcg from last ED visit on 11/2. She has an appointment with the [facility] on 12/13.  She has not had an ultrasound to confirm IUP during this pregnancy.  Review of  Systems  Positive: Abdominal pain, nausea, vomiting, diarrhea Negative: fever  Physical Exam  BP 126/74 (BP Location: Right Arm)   Pulse (!) 105   Temp 98 F (36.7 C) (Oral)   Resp 18   SpO2 100%  General: Awake, no distress  HEENT: Atraumatic  Resp: Normal effort  Cardiac: Normal rate Abd: Nondistended, nontender  MSK: Moves all extremities without difficulty Neuro: Speech clear  Medical Decision Making  Pt evaluated for pregnancy concern and is stable for transfer to MAU. Pt is in agreement with plan for transfer.  11:57 AM Discussed with MAU APP, Shanda Bumps, who accepts patient in transfer. Discussed with Shanda Bumps history of ulcerative colitis. Shanda Bumps notes patient will be evaluated at MAU to confirm IUP given abdominal pain and if needed will be sent back to the ED for further work-up of diarrhea. Shanda Bumps informed of tachycardia in triage; however first HR was when patient was actively vomiting and upon recheck HR 105.  Clinical Impression  No diagnosis found.     Mannie Stabile, PA-C 12/09/20 1201    Glendora Score, MD 12/09/20 1623

## 2020-12-09 NOTE — ED Triage Notes (Signed)
Patient here for evaluation of abdominal pain, emesis, and diarrhea that started several weeks ago and still haven't resolved. Seen for same approximately one week ago, no improvement in symptoms. Patient alert, oriented, vomiting in triage.

## 2020-12-09 NOTE — MAU Provider Note (Signed)
History     CSN: [id number]  Arrival date and time: 12/09/20 1131   Event Date/Time   First Provider Initiated Contact with Patient 12/09/20 1343      Chief Complaint  Patient presents with   Emesis During Pregnancy   Emesis   GER NICKS is a 35 y.o. [id number] at 75w3dby Definite LMP of Sept 25, 2022.  She [facility]  Patient states she has been vomiting "for weeks and feels like it is getting worse."  Patient states she tried to eat in ED waiting room, but vomited soon after. Patient states she has been taking zofran, with some relief, but ran out of her prescription yesterday.  Patient reports a history of UC and states she is having a flare-up.  She reports she has had diarrhea that is watery for the past 2 days.  She reports more than 5 episodes today and reports some blood noted.  She is not currently being followed by any GI office and reports that she was taking a "holistic approach" after initial diagnosis.  Patient denies vagina concerns including discharge, bleeding, and irritation.    OB History     Gravida  4   Para  1   Term  1   Preterm  0   AB  2   Living  1      SAB  1   IAB  1   Ectopic  0   Multiple  0   Live Births              Past Medical History:  Diagnosis Date   Anemia    Hypokalemia 12/09/2019   Hyponatremia 12/11/2019   Pre-diabetes    Ulcerative colitis ([facility]     Past Surgical History:  Procedure Laterality Date   BIOPSY  12/11/2019   Procedure: BIOPSY;  Surgeon: BOtis Brace MD;  [place] [facility]  Service: Gastroenterology;;   COLONOSCOPY N/A 12/11/2019   Procedure: COLONOSCOPY;  Surgeon: BOtis Brace MD;  [facility];  Service: Gastroenterology;  Laterality: N/A;   dilation and curetage     NO PAST SURGERIES      Family History  Problem Relation Age of Onset   Heart disease Mother    Hypertension Father    Diabetes Father     Diabetes Maternal Grandmother    Diabetes Paternal Grandfather     Social History   Tobacco Use   Smoking status: Former   Smokeless tobacco: Never  Substance Use Topics   Alcohol use: Not Currently   Drug use: No    Allergies: No Known Allergies  Medications Prior to Admission  Medication Sig Dispense Refill Last Dose   ondansetron (ZOFRAN ODT) 4 MG disintegrating tablet Take 1 tablet (4 mg total) by mouth every 8 (eight) hours as needed for nausea or vomiting. 8 tablet 0 12/08/2020   blood glucose meter kit and supplies Dispense based on patient and insurance preference. Use up to four times daily as directed. (FOR ICD-10 E10.9, E11.9). 1 each 0    Blood Glucose Monitoring Suppl (TRUE METRIX METER) w/Device KIT USE AS DIRECTED 1 kit 0    glucose blood test strip USE AS DIRECTED FOUR TIMES DAILY 100 strip 0    Insulin Syringe-Needle U-100 30G X 1/2" 0.3 ML MISC USE AS DIRECTED WITH INSULIN 30 each 0    loperamide (IMODIUM A-D) 2 MG tablet Take 2 mg by mouth as needed for  diarrhea or loose stools.      predniSONE (DELTASONE) 10 MG tablet TAKE 4 TABS BY MOUTH DAILY ON 11/13-11/19, 3 TABS DAILY ON 11/20-11/26, 2 TABS DAILY ON 11/27-12/3, AND 1 TAB DAILY ON 12/3-12/10 (Patient not taking: No sig reported) 70 tablet 0    predniSONE (STERAPRED UNI-PAK 21 TAB) 10 MG (21) TBPK tablet Take 33m daily 11/13-11/19, 353mdaily 11/20-11/26, 2034maily 11/27-12/3, and 6m70mily 12/3-12/10 (Patient not taking: No sig reported) 70 tablet 0    Probiotic Product (PROBIOTIC BLEND) CAPS Take 1-2 capsules by mouth daily.      TRUEplus Lancets 28G MISC USE AS DIRECTED 100 each 0     Review of Systems  Constitutional:  Negative for chills and fever.  Gastrointestinal:  Positive for abdominal pain, blood in stool and diarrhea.  Genitourinary:  Negative for difficulty urinating, dysuria, vaginal bleeding and vaginal discharge.  Neurological:  Positive for dizziness, weakness, light-headedness and  headaches.  Physical Exam   Blood pressure 126/74, pulse (!) 105, temperature 98 F (36.7 C), temperature source Oral, resp. rate 18, last menstrual period 10/25/2020, SpO2 100 %.  Physical Exam Constitutional:      General: She is not in acute distress.    Appearance: Normal appearance. She is not toxic-appearing.  HENT:     Head: Normocephalic and atraumatic.  Eyes:     Conjunctiva/sclera: Conjunctivae normal.  Cardiovascular:     Rate and Rhythm: Normal rate.  Pulmonary:     Effort: Pulmonary effort is normal. No respiratory distress.     Breath sounds: Normal breath sounds.  Abdominal:     General: Bowel sounds are normal.     Tenderness: There is abdominal tenderness in the epigastric area. There is no guarding.  Skin:    General: Skin is warm and dry.  Neurological:     General: No focal deficit present.     Mental Status: She is alert and oriented to person, place, and time.  Psychiatric:        Mood and Affect: Mood normal.        Behavior: Behavior normal.    MAU Course  Procedures  MDM Start IV LR Bolus Antiemetic PPI Antidiarrhea GI Consult Ultrasound Assessment and Plan  35 y30r old G4P1021 at 6.3 weeks Nausea/Vomiting Pregnancy of Unknown Location  -POC Reviewed. -Exam performed and findings discussed. -Informed that epigastric pain likely r/t extensive vomiting and diarrhea. -Will start IV and given LR bolus. -Will give Reglan for nausea. -Discussed getting GI consult either as referral or during time here.   JessMaryann Conners9/2022, 1:57 PM   Reassessment (2:55 PM)  -Patient resting in bed. -Reports continued diarrhea. -Reports improvement in nausea and abdominal pain. -Fluids and pepcid infusing. -Informed that provider will contact GI to get advice on management of symptoms. -OnCall PA, S. GChauncey Cruelbbins contacted and gives provider [facility] information. -EagSadie Haber A. CollSilverio Laytacted, regarding patient, via secure chat. Consults with  OnCall PA to decide management.  Reassessment (3:43 PM) -V. Cirigliano, DO at bedside and discusses POC with patient. *States he will give prednisone series, order labs, and have patient follow up as appropriate. *We appreciate him and his team taking the time for consult and evaluation. -US rKoreaurns and EDC c/w LMP. -Will collect labs as ordered by GI team. -Will plan to discharge upon completion of all tasks. -List of providers will be given.  Reassessment (4:48 PM) -Patient states improvement in symptoms.  -Reviewed US fKoreadings. -Dr. CiriBryan Lemmatacted and places  medication for discharge. -Will also give reglan for prn usage of nausea and vomiting. -Patient stat follow up with GI as discussed and start [facility] as appropriate. -Encouraged to call primary office or return to MAU if symptoms worsen or with the onset of new symptoms. -Discharged to home in stable condition.  Maryann Conners MSN, CNM Advanced Practice Provider, [facility]

## 2020-12-09 NOTE — MAU Note (Signed)
Patient arrived to MAU from ED c/o N/V/D, weakness , sharp pain in chest and epigastric pain for that has gotten worse over the past 3 weeks. Patient stated that she was diagnosed with ulcerative colitis last year. Patient stated that she was prescribed zofran but ran out, and she said " it is not helping"

## 2020-12-09 NOTE — Discharge Instructions (Signed)
  [place] [facility] Providers          [facility]  [address], [place], [place] [address]  [phone]  [facility]  [address], [place], [place] [address]  [phone]  [facility]  [address], [place], [place] [address]  [phone]  [facility] [place]  [address], [place], [place] [address]  [phone]  [facility]  [address] floor), [place], [place] [address]  [phone]  [facility] [address], [place], [place] [address] [phone]  [facility]  [address], [place], [place] [address]  [phone]  [facility]  [address], [place], [place] [address]  [phone]  [facility]  [address], [place], [place] [address]  [phone]  [facility]  [address], [place], [place] [address]  [phone]  [facility]  [address], [place], [place] [address]  [phone]  [facility]  [address], [place], [place] [address]  [phone]  [facility]   [address], [place], [place] [address]  [phone]  [facility]  [address], [place], [place] [address]   [phone]  [facility]  [address], [place], [place] [address]  [phone]         

## 2020-12-10 ENCOUNTER — Other Ambulatory Visit: Payer: Self-pay | Admitting: Student

## 2020-12-10 ENCOUNTER — Telehealth: Payer: Self-pay | Admitting: Student

## 2020-12-10 LAB — GASTROINTESTINAL PANEL BY PCR, STOOL (REPLACES STOOL CULTURE)

## 2020-12-10 MED ORDER — LOPERAMIDE HCL 2 MG PO TABS: 2.0000 mg | ORAL_TABLET | ORAL | 1 refills | Status: DC | PRN

## 2020-12-10 MED ORDER — METOCLOPRAMIDE HCL 10 MG PO TABS: 10.0000 mg | ORAL_TABLET | Freq: Four times a day (QID) | ORAL | 0 refills | Status: DC

## 2020-12-10 MED ORDER — ONDANSETRON 4 MG PO TBDP: 4.0000 mg | ORAL_TABLET | Freq: Three times a day (TID) | ORAL | 0 refills | Status: DC | PRN

## 2020-12-10 MED ORDER — PROBIOTIC BLEND PO CAPS: 1.0000 | ORAL_CAPSULE | Freq: Every day | ORAL | 1 refills | Status: DC

## 2020-12-10 MED ORDER — PREDNISONE 10 MG PO TABS
40.0000 mg | ORAL_TABLET | Freq: Every day | ORAL | 0 refills | Status: DC
Start: 2020-12-10 — End: 2021-07-14

## 2020-12-10 NOTE — Telephone Encounter (Signed)
Returned call from patient who reports that her medicines are not the pharmacy from her MAU visit yesterday. I called patient; confirmed medicines that should be RX and sent them in for her. She thanked me for the help.   [name]

## 2020-12-11 ENCOUNTER — Other Ambulatory Visit: Payer: Self-pay

## 2020-12-15 ENCOUNTER — Other Ambulatory Visit: Payer: Self-pay | Admitting: Lactation Services

## 2020-12-15 MED ORDER — ONDANSETRON 4 MG PO TBDP
4.0000 mg | ORAL_TABLET | Freq: Three times a day (TID) | ORAL | 0 refills | Status: DC | PRN
Start: 2020-12-15 — End: 2023-04-20

## 2020-12-15 NOTE — Progress Notes (Signed)
Zofran re-ordered to send to pharmacy as last prescription printed in the office.

## 2020-12-22 ENCOUNTER — Encounter: Payer: Self-pay | Admitting: *Deleted

## 2020-12-28 ENCOUNTER — Encounter: Payer: Self-pay | Admitting: Student

## 2020-12-31 ENCOUNTER — Telehealth: Payer: Self-pay | Admitting: General Surgery

## 2020-12-31 DIAGNOSIS — K51011 Ulcerative (chronic) pancolitis with rectal bleeding: Secondary | ICD-10-CM

## 2020-12-31 NOTE — Telephone Encounter (Signed)
Left a message for the patient to contact the office. She will need to have a new Quantiferon Gold drawn, the one drew in the hospital hemolized and was not able to be processed. Order placed for lab, pt may go to [facility]

## 2020-12-31 NOTE — Telephone Encounter (Signed)
-----   Message from [facility], DO sent at 12/29/2020  5:30 PM EST ----- Regarding: RE: Reginold Agent I had seen her as a consult during inpatient evaluation, but has not followed up as outpatient.   French Ana, please assist in ordered Quantiferon Gold as part of work up for possibly starting biologic therapy.   Thanks.   ----- Message ----- From: Kathi Der, MD Sent: 12/29/2020   3:17 PM EST To: Shellia Cleverly, DO, Marylen Ponto, NP Subject: RE: Reginold Agent                                      Patient is currently followed by Dr. Barron Alvine. Adding him to conversation.  ----- Message ----- From: Marylen Ponto, NP Sent: 12/29/2020   3:04 PM EST To: Kathi Der, MD Subject: Annell GreeningInda Castle afternoon Dr. Levora Angel,  I was told today that the below message should have gone to you instead of the person that ordered the [facility] for this patient. Please see below.  Thank you, Joni Reining ----- Message ----- From: Jonah Blue Sent: 12/29/2020   1:54 PM EST To: Marylen Ponto, NP Subject: RE: Reginold Agent                                      Not my pt.  Notify Dr Doretha Imus, I do not work for him.   ----- Message ----- From: Marylen Ponto, NP Sent: 12/20/2020   9:22 AM EST To: Dianah Field, PA-C Subject: Inda Castle morning Sarah,  I am on the phone with the [facility] lab right now and they are trying to get in contact with you to let you know that the [facility] you ordered on this patient was hemolyzed and could not be run. I see you ordered this as part of your GI care plan and will be following with her outpatient, so I wanted to let you know that is why the test has not resulted so you can redraw the test if needed. If you have any questions, you can contact [facility], but they have told me that they cannot run the test unless it is redrawn.  Thank you, Joni Reining

## 2021-01-05 ENCOUNTER — Telehealth: Payer: 59

## 2021-01-12 ENCOUNTER — Ambulatory Visit: Payer: 59 | Admitting: Student

## 2021-01-13 ENCOUNTER — Other Ambulatory Visit (HOSPITAL_COMMUNITY): Payer: Self-pay

## 2021-03-01 ENCOUNTER — Encounter (HOSPITAL_COMMUNITY): Payer: Self-pay | Admitting: Emergency Medicine

## 2021-03-01 ENCOUNTER — Other Ambulatory Visit: Payer: Self-pay

## 2021-03-01 ENCOUNTER — Emergency Department (HOSPITAL_COMMUNITY)
Admission: EM | Admit: 2021-03-01 | Discharge: 2021-03-02 | Disposition: A | Discharge status: Home / Self Care | Payer: 59 | Attending: Emergency Medicine | Admitting: Emergency Medicine

## 2021-03-01 ENCOUNTER — Other Ambulatory Visit: Payer: Self-pay | Admitting: Gastroenterology

## 2021-03-01 DIAGNOSIS — R252 Cramp and spasm: Secondary | ICD-10-CM | POA: Diagnosis present

## 2021-03-01 DIAGNOSIS — E860 Dehydration: Secondary | ICD-10-CM | POA: Insufficient documentation

## 2021-03-01 DIAGNOSIS — K5100 Ulcerative (chronic) pancolitis without complications: Secondary | ICD-10-CM

## 2021-03-01 DIAGNOSIS — M791 Myalgia, unspecified site: Secondary | ICD-10-CM

## 2021-03-01 LAB — COMPREHENSIVE METABOLIC PANEL
ALT: 7 U/L (ref 0–44)
AST: 13 U/L — ABNORMAL LOW (ref 15–41)
Albumin: 1.6 g/dL — ABNORMAL LOW (ref 3.5–5.0)
Alkaline Phosphatase: 111 U/L (ref 38–126)
Anion gap: 6 (ref 5–15)
BUN: 8 mg/dL (ref 6–20)
CO2: 28 mmol/L (ref 22–32)
Calcium: 7.9 mg/dL — ABNORMAL LOW (ref 8.9–10.3)
Chloride: 99 mmol/L (ref 98–111)
Creatinine, Ser: 0.87 mg/dL (ref 0.44–1.00)
GFR, Estimated: >60 mL/min (ref >60)
Glucose, Bld: 104 mg/dL — ABNORMAL HIGH (ref 70–99)
Potassium: 3.6 mmol/L (ref 3.5–5.1)
Sodium: 133 mmol/L — ABNORMAL LOW (ref 135–145)
Total Bilirubin: 0.3 mg/dL (ref 0.3–1.2)
Total Protein: 7 g/dL (ref 6.5–8.1)

## 2021-03-01 LAB — I-STAT BETA HCG BLOOD, ED (MC, WL, AP ONLY): I-stat hCG, quantitative: 15.1 m[IU]/mL — ABNORMAL HIGH (ref <5)

## 2021-03-01 LAB — CBC
HCT: 28.9 % — ABNORMAL LOW (ref 36.0–46.0)
Hemoglobin: 9.3 g/dL — ABNORMAL LOW (ref 12.0–15.0)
MCH: 20.9 pg — ABNORMAL LOW (ref 26.0–34.0)
MCHC: 32.2 g/dL (ref 30.0–36.0)
MCV: 65.1 fL — ABNORMAL LOW (ref 80.0–100.0)
Platelets: 689 10*3/uL — ABNORMAL HIGH (ref 150–400)
RBC: 4.44 MIL/uL (ref 3.87–5.11)
RDW: 18.9 % — ABNORMAL HIGH (ref 11.5–15.5)
WBC: 14.3 10*3/uL — ABNORMAL HIGH (ref 4.0–10.5)
nRBC: 0 % (ref 0.0–0.2)

## 2021-03-01 LAB — LIPASE, BLOOD: Lipase: 26 U/L (ref 11–51)

## 2021-03-01 NOTE — ED Provider Triage Note (Signed)
Emergency Medicine Provider Triage Evaluation Note  [name] , a 36 y.o. female  was evaluated in triage.  Pt complains of pain, nausea, vomiting, diarrhea, and cramping.  Patient was seen at her GI doctor for an ulcerative colitis follow-up today.  She states that after she left she had cramping in both of her legs as well as her ankles.  She has had this before when she had electrolyte imbalances.  She had labs drawn, but they have not resulted.   Review of Systems  Positive: Abdominal pain, nausea, vomiting, diarrhea, chills, leg cramping Negative: Fever, chest pain, shortness of breath  Physical Exam  BP (!) 158/139    Pulse (!) 124    Temp 99.1 F (37.3 C) (Oral)    Resp 16    Ht 4\' 11"  (1.499 m)    Wt 59.9 kg    LMP 10/25/2020    SpO2 100%    Breastfeeding Unknown    BMI 26.66 kg/m  Gen:   Awake, no distress   Resp:  Normal effort  MSK:   Moves extremities without difficulty  Other:    Medical Decision Making  Medically screening exam initiated at 3:45 PM.  Appropriate orders placed.  [name] was informed that the remainder of the evaluation will be completed by another provider, this initial triage assessment does not replace that evaluation, and the importance of remaining in the ED until their evaluation is complete.     Kateri Plummer, PA-C 03/01/21 1546

## 2021-03-01 NOTE — ED Triage Notes (Signed)
Patient reports bilateral ankle cramping that woke her from her sleep about 45 mins ago. States she saw her GI doctor today for her ulcerative colitis and had labs drawn. Reports emesis and diarrhea for about 4 days.

## 2021-03-02 LAB — URINALYSIS, MICROSCOPIC (REFLEX)

## 2021-03-02 LAB — URINALYSIS, ROUTINE W REFLEX MICROSCOPIC
Bilirubin Urine: NEGATIVE
Glucose, UA: NEGATIVE mg/dL
Ketones, ur: NEGATIVE mg/dL
Leukocytes,Ua: NEGATIVE
Nitrite: NEGATIVE
Protein, ur: NEGATIVE mg/dL
Specific Gravity, Urine: 1.01 (ref 1.005–1.030)
pH: 6 (ref 5.0–8.0)

## 2021-03-02 LAB — HCG, QUANTITATIVE, PREGNANCY: hCG, Beta Chain, Quant, S: 11 m[IU]/mL — ABNORMAL HIGH (ref <5)

## 2021-03-02 MED ORDER — DIAZEPAM 5 MG PO TABS
5.0000 mg | ORAL_TABLET | Freq: Once | ORAL | Status: AC
  Administered 2021-03-02: 5 mg via ORAL
  Filled 2021-03-02: qty 1

## 2021-03-02 MED ORDER — SODIUM CHLORIDE 0.9 % IV BOLUS
1000.0000 mL | Freq: Once | INTRAVENOUS | Status: AC
  Administered 2021-03-02: 1000 mL via INTRAVENOUS

## 2021-03-02 NOTE — ED Provider Notes (Signed)
[facility] EMERGENCY DEPARTMENT Provider Note   CSN: [id number] Arrival date & time: 03/01/21  1513     History  Chief Complaint  Patient presents with   Muscle Pain    Cramps    [name] is a 36 y.o. female.  Patient presents to the emergency department for evaluation of leg cramping.  Patient reports that she has had nausea, vomiting and diarrhea for the last 4 days.  Patient was awakened from sleep with cramping of her calves and inability to flex her ankles secondary to muscle cramps.  Pain and cramping comes and goes.      Home Medications Prior to Admission medications   Medication Sig Start Date End Date Taking? Authorizing Provider  loperamide (IMODIUM A-D) 2 MG tablet Take 1 tablet (2 mg total) by mouth 4 (four) times daily as needed for diarrhea or loose stools. 12/10/20   Marylene Land, CNM  metoCLOPramide (REGLAN) 10 MG tablet Take 1 tablet (10 mg total) by mouth every 6 (six) hours. 12/10/20   Marylene Land, CNM  ondansetron (ZOFRAN ODT) 4 MG disintegrating tablet Take 1 tablet (4 mg total) by mouth every 8 (eight) hours as needed for nausea or vomiting. 12/15/20   Marylene Land, CNM  predniSONE (DELTASONE) 10 MG tablet Take 4 tablets (40 mg total) by mouth daily. 40 mg/day for 2 weeks, then 30 mg/day x 7 days, 20 mg/day x7 days, 15 mg/day x7 days, 10 mg/day x7 days, 5 mg/day x7 days, then discontinue. 12/10/20   Marylene Land, CNM  Probiotic Product (PROBIOTIC BLEND) CAPS Take 1-2 capsules by mouth daily. 12/10/20   Marylene Land, CNM  insulin glargine (LANTUS) 100 UNIT/ML injection Inject 0.1 mLs (10 Units total) into the skin at bedtime. 12/13/19 12/18/19  Eliezer Bottom, MD      Allergies    Patient has no known allergies.    Review of Systems   Review of Systems  Musculoskeletal:  Positive for myalgias.   Physical Exam Updated Vital Signs BP 98/70    Pulse 80    Temp 98  F (36.7 C)    Resp 17    Ht 4\' 11"  (1.499 m)    Wt 59.9 kg    LMP 10/25/2020    SpO2 98%    Breastfeeding Unknown    BMI 26.66 kg/m  Physical Exam Vitals and nursing note reviewed.  Constitutional:      General: She is not in acute distress.    Appearance: She is well-developed.  HENT:     Head: Normocephalic and atraumatic.  Eyes:     Conjunctiva/sclera: Conjunctivae normal.  Cardiovascular:     Rate and Rhythm: Normal rate and regular rhythm.     Heart sounds: No murmur heard. Pulmonary:     Effort: Pulmonary effort is normal. No respiratory distress.     Breath sounds: Normal breath sounds.  Abdominal:     Palpations: Abdomen is soft.     Tenderness: There is no abdominal tenderness.  Musculoskeletal:        General: No swelling.     Cervical back: Neck supple.     Comments: No calf tenderness, swelling  Skin:    General: Skin is warm and dry.     Capillary Refill: Capillary refill takes less than 2 seconds.  Neurological:     Mental Status: She is alert.  Psychiatric:        Mood and Affect: Mood normal.  ED Results / Procedures / Treatments   Labs (all labs ordered are listed, but only abnormal results are displayed) Labs Reviewed  COMPREHENSIVE METABOLIC PANEL - Abnormal; Notable for the following components:      Result Value   Sodium 133 (*)    Glucose, Bld 104 (*)    Calcium 7.9 (*)    Albumin 1.6 (*)    AST 13 (*)    All other components within normal limits  CBC - Abnormal; Notable for the following components:   WBC 14.3 (*)    Hemoglobin 9.3 (*)    HCT 28.9 (*)    MCV 65.1 (*)    MCH 20.9 (*)    RDW 18.9 (*)    Platelets 689 (*)    All other components within normal limits  URINALYSIS, ROUTINE W REFLEX MICROSCOPIC - Abnormal; Notable for the following components:   Hgb urine dipstick SMALL (*)    All other components within normal limits  HCG, QUANTITATIVE, PREGNANCY - Abnormal; Notable for the following components:   hCG, Beta Chain, Quant,  S 11 (*)    All other components within normal limits  URINALYSIS, MICROSCOPIC (REFLEX) - Abnormal; Notable for the following components:   Bacteria, UA RARE (*)    All other components within normal limits  I-STAT BETA HCG BLOOD, ED ([facility], [facility], [facility] ONLY) - Abnormal; Notable for the following components:   I-stat hCG, quantitative 15.1 (*)    All other components within normal limits  LIPASE, BLOOD    EKG None  Radiology No results found.  Procedures Procedures    Medications Ordered in ED Medications  sodium chloride 0.9 % bolus 1,000 mL (1,000 mLs Intravenous New Bag/Given 03/02/21 0457)  diazepam (VALIUM) tablet 5 mg (5 mg Oral Given 03/02/21 0456)    ED Course/ Medical Decision Making/ A&P                           Medical Decision Making Amount and/or Complexity of Data Reviewed Labs: ordered.  Risk Prescription drug management.   Patient presents to the Emergency Department for evaluation of muscle cramping in the setting of acute volume loss from nausea, vomiting and diarrhea.  Kidney function and electrolytes are normal.  Patient was tachycardic at arrival, given IV fluids with improvement.  Abdominal exam is benign, nontender.  No concern for acute surgical process.  Repeat exam after IV fluids reveals benign abdominal exam.  She has follow-up scheduled with her GI doctor, given return precautions.        Final Clinical Impression(s) / ED Diagnoses Final diagnoses:  Muscle ache  Dehydration    Rx / DC Orders ED Discharge Orders     None         Kodee Drury, Canary Brim, MD 03/02/21 [id number]

## 2021-03-03 DIAGNOSIS — Z419 Encounter for procedure for purposes other than remedying health state, unspecified: Secondary | ICD-10-CM | POA: Diagnosis not present

## 2021-03-04 ENCOUNTER — Ambulatory Visit
Admission: RE | Admit: 2021-03-04 | Discharge: 2021-03-04 | Disposition: A | Discharge status: Home / Self Care | Payer: 59 | Source: Ambulatory Visit | Attending: Gastroenterology | Admitting: Gastroenterology

## 2021-03-04 DIAGNOSIS — K5100 Ulcerative (chronic) pancolitis without complications: Secondary | ICD-10-CM

## 2021-03-04 MED ORDER — IOPAMIDOL (ISOVUE-300) INJECTION 61%
100.0000 mL | Freq: Once | INTRAVENOUS | Status: AC | PRN
  Administered 2021-03-04: 100 mL via INTRAVENOUS

## 2021-03-31 DIAGNOSIS — Z419 Encounter for procedure for purposes other than remedying health state, unspecified: Secondary | ICD-10-CM | POA: Diagnosis not present

## 2021-04-06 ENCOUNTER — Other Ambulatory Visit: Payer: Self-pay | Admitting: Student

## 2021-05-01 DIAGNOSIS — Z419 Encounter for procedure for purposes other than remedying health state, unspecified: Secondary | ICD-10-CM | POA: Diagnosis not present

## 2021-05-06 DIAGNOSIS — K5100 Ulcerative (chronic) pancolitis without complications: Secondary | ICD-10-CM | POA: Diagnosis not present

## 2021-05-12 ENCOUNTER — Telehealth: Payer: Self-pay | Admitting: Gastroenterology

## 2021-05-12 NOTE — Telephone Encounter (Signed)
Incoming records for Dr. Otis Brace, 10 pgs, sent to scan team to scan into patients' chart. 05/12/21  yk ?

## 2021-05-20 DIAGNOSIS — K5100 Ulcerative (chronic) pancolitis without complications: Secondary | ICD-10-CM | POA: Diagnosis not present

## 2021-05-20 DIAGNOSIS — D509 Iron deficiency anemia, unspecified: Secondary | ICD-10-CM | POA: Diagnosis not present

## 2021-05-20 DIAGNOSIS — D649 Anemia, unspecified: Secondary | ICD-10-CM | POA: Diagnosis not present

## 2021-05-20 DIAGNOSIS — K5190 Ulcerative colitis, unspecified, without complications: Secondary | ICD-10-CM | POA: Diagnosis not present

## 2021-05-31 DIAGNOSIS — Z419 Encounter for procedure for purposes other than remedying health state, unspecified: Secondary | ICD-10-CM | POA: Diagnosis not present

## 2021-06-22 ENCOUNTER — Encounter: Payer: Self-pay | Admitting: Gastroenterology

## 2021-07-01 DIAGNOSIS — Z419 Encounter for procedure for purposes other than remedying health state, unspecified: Secondary | ICD-10-CM | POA: Diagnosis not present

## 2021-07-14 ENCOUNTER — Other Ambulatory Visit: Payer: Self-pay

## 2021-07-14 ENCOUNTER — Other Ambulatory Visit (INDEPENDENT_AMBULATORY_CARE_PROVIDER_SITE_OTHER): Payer: Medicaid Other

## 2021-07-14 ENCOUNTER — Telehealth: Payer: Self-pay | Admitting: Oncology

## 2021-07-14 ENCOUNTER — Ambulatory Visit (INDEPENDENT_AMBULATORY_CARE_PROVIDER_SITE_OTHER): Payer: Medicaid Other | Admitting: Gastroenterology

## 2021-07-14 ENCOUNTER — Telehealth: Payer: Self-pay

## 2021-07-14 ENCOUNTER — Encounter: Payer: Self-pay | Admitting: Gastroenterology

## 2021-07-14 VITALS — BP 86/52 | HR 90 | Ht 59.0 in | Wt 99.5 lb

## 2021-07-14 DIAGNOSIS — D649 Anemia, unspecified: Secondary | ICD-10-CM

## 2021-07-14 DIAGNOSIS — K625 Hemorrhage of anus and rectum: Secondary | ICD-10-CM

## 2021-07-14 DIAGNOSIS — K51011 Ulcerative (chronic) pancolitis with rectal bleeding: Secondary | ICD-10-CM | POA: Diagnosis not present

## 2021-07-14 DIAGNOSIS — R197 Diarrhea, unspecified: Secondary | ICD-10-CM | POA: Diagnosis not present

## 2021-07-14 LAB — FOLATE: Folate: 7.8 ng/mL (ref >5.9)

## 2021-07-14 LAB — CBC
HCT: 24.1 % — ABNORMAL LOW (ref 36.0–46.0)
Hemoglobin: 7.5 g/dL — CL (ref 12.0–15.0)
MCHC: 31 g/dL (ref 30.0–36.0)
MCV: 56.3 fl — ABNORMAL LOW (ref 78.0–100.0)
Platelets: 723 10*3/uL — ABNORMAL HIGH (ref 150.0–400.0)
RBC: 4.27 Mil/uL (ref 3.87–5.11)
RDW: 19.4 % — ABNORMAL HIGH (ref 11.5–15.5)
WBC: 10 10*3/uL (ref 4.0–10.5)

## 2021-07-14 LAB — COMPREHENSIVE METABOLIC PANEL
ALT: 6 U/L (ref 0–35)
AST: 9 U/L (ref 0–37)
Albumin: 3.1 g/dL — ABNORMAL LOW (ref 3.5–5.2)
Alkaline Phosphatase: 67 U/L (ref 39–117)
BUN: 9 mg/dL (ref 6–23)
CO2: 29 mEq/L (ref 19–32)
Calcium: 8.3 mg/dL — ABNORMAL LOW (ref 8.4–10.5)
Chloride: 103 mEq/L (ref 96–112)
Creatinine, Ser: 0.63 mg/dL (ref 0.40–1.20)
GFR: 115.87 mL/min (ref >60.00)
Glucose, Bld: 117 mg/dL — ABNORMAL HIGH (ref 70–99)
Potassium: 3.3 mEq/L — ABNORMAL LOW (ref 3.5–5.1)
Sodium: 136 mEq/L (ref 135–145)
Total Bilirubin: 0.3 mg/dL (ref 0.2–1.2)
Total Protein: 7.3 g/dL (ref 6.0–8.3)

## 2021-07-14 LAB — FERRITIN: Ferritin: 12.7 ng/mL (ref 10.0–291.0)

## 2021-07-14 LAB — IBC PANEL
Iron: 13 ug/dL — ABNORMAL LOW (ref 42–145)
Saturation Ratios: 3.8 % — ABNORMAL LOW (ref 20.0–50.0)
TIBC: 344.4 ug/dL (ref 250.0–450.0)
Transferrin: 246 mg/dL (ref 212.0–360.0)

## 2021-07-14 LAB — SEDIMENTATION RATE: Sed Rate: 82 mm/hr — ABNORMAL HIGH (ref 0–20)

## 2021-07-14 LAB — C-REACTIVE PROTEIN: CRP: 2.4 mg/dL (ref 0.5–20.0)

## 2021-07-14 LAB — VITAMIN B12: Vitamin B-12: 364 pg/mL (ref 211–911)

## 2021-07-14 MED ORDER — PREDNISONE 20 MG PO TABS: ORAL_TABLET | ORAL | 0 refills | Status: AC

## 2021-07-14 NOTE — Telephone Encounter (Signed)
Scheduled appt per 6/14 referral. Pt is aware of appt date and time. Pt is aware to arrive 15 mins prior to appt time and to bring and updated insurance card. Pt is aware of appt location.   

## 2021-07-14 NOTE — Progress Notes (Signed)
07/14/2021 Sande Brothers [id number] 1986-01-06   HISTORY OF PRESENT ILLNESS: This is a 36 year old female who is a patient of Dr. Frankey Shown.  She was seen by him during hospital stay back in November 2022.  She was seen as an unassigned consult at that time.  Previously had been seen in November 2021 by [facility] as unassigned.  She did end up following up with [facility] in January of this year and was seen up until March.  They actually were going to initiate Remicade, but then her insurance changed and [facility] no longer accepts her insurance so then they could not prescribe her Remicade.  Since then she has just been on varying doses of prednisone.  She is currently on 20 mg of prednisone, but continues to have diarrhea several times, up to 10 times daily as well as rectal bleeding.  Colonoscopy 12/2019: Mayo score 3 continuous circumferential pancolitis.  Ileum normal.  Pathology confirmed moderately active colitis in the right colon and severe colitis in the left colon  Past Medical History:  Diagnosis Date   Anemia    Hypokalemia 12/09/2019   Hyponatremia 12/11/2019   Pre-diabetes    Ulcerative colitis [facility])    Past Surgical History:  Procedure Laterality Date   BIOPSY  12/11/2019   Procedure: BIOPSY;  Surgeon: Kathi Der, MD;  Location: [facility] ENDOSCOPY;  Service: Gastroenterology;;   COLONOSCOPY N/A 12/11/2019   Procedure: COLONOSCOPY;  Surgeon: Kathi Der, MD;  Location: [facility] ENDOSCOPY;  Service: Gastroenterology;  Laterality: N/A;   COLONOSCOPY     NO PAST SURGERIES      reports that she has never smoked. She has never used smokeless tobacco. She reports current alcohol use. She reports current drug use. Frequency: 7.00 times per week. Drug: Marijuana. family history includes Diabetes in her father, maternal grandmother, and paternal grandfather; Heart disease in her maternal grandfather; Hypertension in her father; Stomach cancer in her maternal aunt. No Known  Allergies    Outpatient Encounter Medications as of 07/14/2021  Medication Sig   loperamide (IMODIUM A-D) 2 MG tablet Take 1 tablet (2 mg total) by mouth 4 (four) times daily as needed for diarrhea or loose stools.   metoCLOPramide (REGLAN) 10 MG tablet Take 10 mg by mouth every 6 (six) hours as needed for nausea.   ondansetron (ZOFRAN ODT) 4 MG disintegrating tablet Take 1 tablet (4 mg total) by mouth every 8 (eight) hours as needed for nausea or vomiting.   predniSONE (DELTASONE) 10 MG tablet Take 4 tablets (40 mg total) by mouth daily. 40 mg/day for 2 weeks, then 30 mg/day x 7 days, 20 mg/day x7 days, 15 mg/day x7 days, 10 mg/day x7 days, 5 mg/day x7 days, then discontinue.   Probiotic Product (PROBIOTIC BLEND) CAPS Take 1-2 capsules by mouth daily. (Patient not taking: Reported on 07/14/2021)   [DISCONTINUED] insulin glargine (LANTUS) 100 UNIT/ML injection Inject 0.1 mLs (10 Units total) into the skin at bedtime.   [DISCONTINUED] metoCLOPramide (REGLAN) 10 MG tablet Take 1 tablet (10 mg total) by mouth every 6 (six) hours. (Patient not taking: Reported on 07/14/2021)   No facility-administered encounter medications on file as of 07/14/2021.     REVIEW OF SYSTEMS  : All other systems reviewed and negative except where noted in the History of Present Illness.   PHYSICAL EXAM: BP (!) 86/52   Pulse 90   Ht 4\' 11"  (1.499 m)   Wt 99 lb 8 oz (45.1 kg)  LMP 10/25/2020   BMI 20.10 kg/m  General: Well developed AA female in no acute distress Head: Normocephalic and atraumatic Eyes:  Sclerae anicteric, conjunctiva pink. Ears: Normal auditory acuity Lungs: Clear throughout to auscultation; no W/R/R. Heart: Regular rate and rhythm; no M/R/G. Abdomen:  Soft, non-distended.  BS present.  Non-tender. Musculoskeletal: Symmetrical with no gross deformities  Skin: No lesions on visible extremities Extremities: No edema  Neurological: Alert oriented x 4, grossly non-focal Psychological:  Alert  and cooperative. Normal mood and affect  ASSESSMENT AND PLAN: *Ulcerative colitis diagnosed November 2021: Still with diarrhea and rectal bleeding daily.  Still not on any type of treatment regimen.  Was supposed to initiate Remicade back in March, but then her insurance changed and [facility] no longer took her insurance so they could not prescribe this for her.  She has just been on varying doses of prednisone since then.  Currently on 20 mg without much relief of her symptoms.  I am going to check a CBC, CMP, sed rate, CRP, QuantiFERON gold, and fecal calprotectin.  Hep B studies 12/2020 ok.  She says that she believes Remicade is still on her formulary.  I am going to send my nurse a message about getting Remicade initiated for her.  I am going to increase her prednisone back to 40 mg daily and have her do that for 2 weeks and then taper by 10 mg weekly after that.  Prescription sent to pharmacy.   CC:  Pa, [facility]*

## 2021-07-14 NOTE — Patient Instructions (Addendum)
If you are age 34 or older, your body mass index should be between 23-30. Your Body mass index is 20.1 kg/m. If this is out of the aforementioned range listed, please consider follow up with your Primary Care Provider.  If you are age 73 or younger, your body mass index should be between 19-25. Your Body mass index is 20.1 kg/m. If this is out of the aformentioned range listed, please consider follow up with your Primary Care Provider.   ________________________________________________________  The [facility] providers would like to encourage you to use [facility] to communicate with providers for non-urgent requests or questions.  Due to long hold times on the telephone, sending your provider a message by [facility] may be a faster and more efficient way to get a response.  Please allow 48 business hours for a response.  Please remember that this is for non-urgent requests.  _______________________________________________________  Your provider has requested that you go to the basement level for lab work before leaving today. Press "B" on the elevator. The lab is located at the first door on the left as you exit the elevator.  We have sent the following medications to your pharmacy for you to pick up at your convenience:  Prednisone taper  Due to recent changes in healthcare laws, you may see the results of your imaging and laboratory studies on MyChart before your provider has had a chance to review them.  We understand that in some cases there may be results that are confusing or concerning to you. Not all laboratory results come back in the same time frame and the provider may be waiting for multiple results in order to interpret others.  Please give [place] 48 hours in order for your provider to thoroughly review all the results before contacting the office for clarification of your results.   You are scheduled to follow up with Dr Barron Alvine on 09-08-2021 at 9:40am.  Thank you for entrusting me with  your care and choosing [facility].  Doug Sou, PA-C

## 2021-07-14 NOTE — Progress Notes (Addendum)
Agree with the assessment and plan as outlined by Doug Sou, PA-C.  If Remicade (or one of the biosimilars) are not on her formulary, she may be a good candidate for the oral UC medication, Zeposia.   Orders entered to initiate Remicade.   Leaner Morici, DO, [facility]

## 2021-07-14 NOTE — Telephone Encounter (Signed)
Phone call from Alonza Bogus, [place] pt needs lab add on's.  IBC F, B12, Folate.  I have entered the order and called the lab.  Spoke with Santiago Glad and she will have the labs completed. Written add on faxed

## 2021-07-19 ENCOUNTER — Encounter: Payer: Self-pay | Admitting: Gastroenterology

## 2021-07-19 NOTE — Addendum Note (Signed)
Addended by: Shellia Cleverly on: 07/19/2021 01:28 PM   Modules accepted: Orders

## 2021-07-20 ENCOUNTER — Telehealth: Payer: Self-pay | Admitting: Pharmacy Technician

## 2021-07-20 LAB — QUANTIFERON-TB GOLD PLUS
Mitogen-NIL: 5.93 IU/mL
NIL: 0.03 IU/mL
QuantiFERON-TB Gold Plus: NEGATIVE
TB1-NIL: <0 IU/mL
TB2-NIL: <0 IU/mL

## 2021-07-20 NOTE — Telephone Encounter (Signed)
Dr. Barron Alvine, [place] Childes note:  Auth Submission: no auth needed Payer: [employer] medicaid Medication & CPT/J Code(s) submitted: Remicade (Infliximab) J1745 Route of submission (phone, fax, portal): portal & phone Auth type: Buy/Bill Units/visits requested: 8 visits Reference number: [id number] Approval from: 07/20/21 to 07/21/22   Patient will be scheduled as soon as possible.

## 2021-07-21 ENCOUNTER — Telehealth: Payer: Self-pay | Admitting: Gastroenterology

## 2021-07-21 NOTE — Telephone Encounter (Signed)
Message has been sent back to the pt via My Chart

## 2021-07-29 ENCOUNTER — Ambulatory Visit (INDEPENDENT_AMBULATORY_CARE_PROVIDER_SITE_OTHER): Payer: Medicaid Other

## 2021-07-29 VITALS — BP 99/64 | HR 92 | Temp 98.4°F | Resp 16 | Ht 59.0 in | Wt 102.8 lb

## 2021-07-29 DIAGNOSIS — K5100 Ulcerative (chronic) pancolitis without complications: Secondary | ICD-10-CM

## 2021-07-29 MED ORDER — METHYLPREDNISOLONE SODIUM SUCC 40 MG IJ SOLR
40.0000 mg | Freq: Once | INTRAMUSCULAR | Status: DC
  Filled 2021-07-29: qty 1

## 2021-07-29 MED ORDER — ACETAMINOPHEN 325 MG PO TABS
650.0000 mg | ORAL_TABLET | Freq: Once | ORAL | Status: AC
  Administered 2021-07-29: 650 mg via ORAL
  Filled 2021-07-29: qty 2

## 2021-07-29 MED ORDER — SODIUM CHLORIDE 0.9 % IV SOLN
200.0000 mg | Freq: Once | INTRAVENOUS | Status: AC
  Administered 2021-07-29: 200 mg via INTRAVENOUS
  Filled 2021-07-29: qty 20

## 2021-07-29 MED ORDER — DIPHENHYDRAMINE HCL 25 MG PO CAPS
25.0000 mg | ORAL_CAPSULE | Freq: Once | ORAL | Status: AC
  Administered 2021-07-29: 25 mg via ORAL
  Filled 2021-07-29: qty 1

## 2021-07-29 NOTE — Progress Notes (Signed)
Diagnosis: Crohn's Disease  Provider:  Chilton Greathouse, MD  Procedure: Infusion  IV Type: Peripheral, IV Location: L Antecubital  Remicade (Infliximab), Dose: 200 mg  Infusion Start Time: 1122  Infusion Stop Time: 1346  Post Infusion IV Care: Observation period completed and Peripheral IV Discontinued  Discharge: Condition: Good, Destination: Home . AVS provided to patient.   Performed by:  Loney Hering, LPN

## 2021-07-31 DIAGNOSIS — Z419 Encounter for procedure for purposes other than remedying health state, unspecified: Secondary | ICD-10-CM | POA: Diagnosis not present

## 2021-08-04 ENCOUNTER — Other Ambulatory Visit: Payer: Self-pay

## 2021-08-04 ENCOUNTER — Inpatient Hospital Stay: Payer: Medicaid Other | Attending: Oncology | Admitting: Oncology

## 2021-08-04 DIAGNOSIS — Z79620 Long term (current) use of immunosuppressive biologic: Secondary | ICD-10-CM

## 2021-08-04 DIAGNOSIS — D509 Iron deficiency anemia, unspecified: Secondary | ICD-10-CM | POA: Insufficient documentation

## 2021-08-04 DIAGNOSIS — K5190 Ulcerative colitis, unspecified, without complications: Secondary | ICD-10-CM | POA: Diagnosis not present

## 2021-08-04 NOTE — Progress Notes (Signed)
Reason for the request:    Iron deficiency anemia  HPI: I was asked by Doug Sou PA-C to evaluate [name] for evaluation of iron deficiency.  She is a 36 year old woman with ulcerative colitis currently receiving treatment with Remicade.  She was found to have iron deficiency anemia documented on July 14, 2021.  Hemoglobin was 7.5 with MCV 56.  Her platelet count of 723.  Iron level of 13 and saturation of 12% and ferritin of 12.7.  Clinically, she denies any hemoptysis or hematemesis.  She denies any hematochezia or melena.  She does have menstrual cycles although they are reasonably under control.  She has been on oral iron therapy in the past but has intolerant to it stomach pain and nausea.  She does report some fatigue and tiredness but no chest pain or palpitation.  She does not report any headaches, blurry vision, syncope or seizures. Does not report any fevers, chills or sweats.  Does not report any cough, wheezing or hemoptysis.  Does not report any chest pain, palpitation, orthopnea or leg edema.  Does not report any nausea, vomiting or abdominal pain.  Does not report any constipation or diarrhea.  Does not report any skeletal complaints.    Does not report frequency, urgency or hematuria.  Does not report any skin rashes or lesions. Does not report any heat or cold intolerance.  Does not report any lymphadenopathy or petechiae.  Does not report any anxiety or depression.  Remaining review of systems is negative.     Past Medical History:  Diagnosis Date   Anemia    Hypokalemia 12/09/2019   Hyponatremia 12/11/2019   Pre-diabetes    Ulcerative colitis (HCC)   :   Past Surgical History:  Procedure Laterality Date   BIOPSY  12/11/2019   Procedure: BIOPSY;  Surgeon: Kathi Der, MD;  Location: [facility];  Service: Gastroenterology;;   COLONOSCOPY N/A 12/11/2019   Procedure: COLONOSCOPY;  Surgeon: Kathi Der, MD;  Location: [facility];  Service: Gastroenterology;   Laterality: N/A;   COLONOSCOPY     NO PAST SURGERIES    :   Current Outpatient Medications:    loperamide (IMODIUM A-D) 2 MG tablet, Take 1 tablet (2 mg total) by mouth 4 (four) times daily as needed for diarrhea or loose stools., Disp: 30 tablet, Rfl: 0   metoCLOPramide (REGLAN) 10 MG tablet, Take 10 mg by mouth every 6 (six) hours as needed for nausea., Disp: , Rfl:    ondansetron (ZOFRAN ODT) 4 MG disintegrating tablet, Take 1 tablet (4 mg total) by mouth every 8 (eight) hours as needed for nausea or vomiting., Disp: 30 tablet, Rfl: 0   predniSONE (DELTASONE) 20 MG tablet, Take 2 tablets (40 mg total) by mouth daily with breakfast for 14 days, THEN 1.5 tablets (30 mg total) daily with breakfast for 7 days, THEN 1 tablet (20 mg total) daily with breakfast for 7 days, THEN 0.5 tablets (10 mg total) daily with breakfast for 7 days., Disp: 49 tablet, Rfl: 0   Probiotic Product (PROBIOTIC BLEND) CAPS, Take 1-2 capsules by mouth daily. (Patient not taking: Reported on 07/14/2021), Disp: 60 capsule, Rfl: 1:  No Known Allergies:   Family History  Problem Relation Age of Onset   Hypertension Father    Diabetes Father    Stomach cancer Maternal Aunt    Diabetes Maternal Grandmother    Heart disease Maternal Grandfather    Diabetes Paternal Grandfather    Colon cancer Neg Hx  Esophageal cancer Neg Hx    Pancreatic cancer Neg Hx   :   Social History   Socioeconomic History   Marital status: Single    Spouse name: Not on file   Number of children: Not on file   Years of education: Not on file   Highest education level: Not on file  Occupational History   Not on file  Tobacco Use   Smoking status: Never   Smokeless tobacco: Never  Vaping Use   Vaping Use: Never used  Substance and Sexual Activity   Alcohol use: Yes    Comment: occ   Drug use: Yes    Frequency: 7.0 times per week    Types: Marijuana   Sexual activity: Yes    Birth control/protection: Injection  Other Topics  Concern   Not on file  Social History Narrative   Not on file   Social Determinants of Health   Financial Resource Strain: Not on file  Food Insecurity: Not on file  Transportation Needs: Not on file  Physical Activity: Not on file  Stress: Not on file  Social Connections: Not on file  Intimate Partner Violence: Not on file  :  Pertinent items are noted in HPI.  Exam:  General appearance: alert and cooperative appeared without distress. Head: atraumatic without any abnormalities. Eyes: conjunctivae/corneas clear. PERRL.  Sclera anicteric. Throat: lips, mucosa, and tongue normal; without oral thrush or ulcers. Resp: clear to auscultation bilaterally without rhonchi, wheezes or dullness to percussion. Cardio: regular rate and rhythm, S1, S2 normal, no murmur, click, rub or gallop GI: soft, non-tender; bowel sounds normal; no masses,  no organomegaly Skin: Skin color, texture, turgor normal. No rashes or lesions Lymph nodes: Cervical, supraclavicular, and axillary nodes normal. Neurologic: Grossly normal without any motor, sensory or deep tendon reflexes. Musculoskeletal: No joint deformity or effusion.   Assessment and Plan:    36 year old woman with:  1.  Iron deficiency anemia documented in June 2023.  Her hemoglobin is 7.5 and iron level of 13.  This is related to chronic GI blood losses from ulcerative colitis in addition to likely chronic menstrual blood losses.  Treatment options at this time were discussed.  These would include oral iron therapy versus intravenous iron.  Risks and benefits of both approaches were discussed.  Complication associated with IV iron infusion including arthralgias, myalgias and rarely anaphylaxis were reiterated.  He will receive Venofer for a total of 1000 mg total dose.  She is agreeable to proceed.  2.  Ulcerative colitis: She is following with gastroenterology at time.  Currently on Remicade.  3.  Follow-up: Will be in the next 6 months  to repeat her iron studies and potentially repeat IV iron infusion if needed.  45  minutes were dedicated to this visit. The time was spent on reviewing laboratory datadiscussing treatment options, discussing differential diagnosis and answering questions regarding future plan.   A copy of this consult has been forwarded to the requesting physician.

## 2021-08-09 ENCOUNTER — Inpatient Hospital Stay: Payer: Medicaid Other

## 2021-08-09 ENCOUNTER — Other Ambulatory Visit: Payer: Self-pay

## 2021-08-09 VITALS — BP 105/50 | HR 67 | Temp 98.7°F | Resp 17

## 2021-08-09 DIAGNOSIS — K5190 Ulcerative colitis, unspecified, without complications: Secondary | ICD-10-CM | POA: Diagnosis not present

## 2021-08-09 DIAGNOSIS — D509 Iron deficiency anemia, unspecified: Secondary | ICD-10-CM

## 2021-08-09 DIAGNOSIS — Z79620 Long term (current) use of immunosuppressive biologic: Secondary | ICD-10-CM | POA: Diagnosis not present

## 2021-08-09 MED ORDER — SODIUM CHLORIDE 0.9 % IV SOLN: Freq: Once | INTRAVENOUS | Status: AC

## 2021-08-09 MED ORDER — SODIUM CHLORIDE 0.9 % IV SOLN
200.0000 mg | Freq: Once | INTRAVENOUS | Status: AC
  Administered 2021-08-09: 200 mg via INTRAVENOUS
  Filled 2021-08-09: qty 200

## 2021-08-09 NOTE — Patient Instructions (Signed)
[facility]  Discharge Instructions: Thank you for choosing [facility] to provide your oncology and hematology care.   If you have a lab appointment with the Cancer Center, please go directly to the Cancer Center and check in at the registration area.   Wear comfortable clothing and clothing appropriate for easy access to any Portacath or PICC line.   We strive to give you quality time with your provider. You may need to reschedule your appointment if you arrive late (15 or more minutes).  Arriving late affects you and other patients whose appointments are after yours.  Also, if you miss three or more appointments without notifying the office, you may be dismissed from the clinic at the provider's discretion.      For prescription refill requests, have your pharmacy contact our office and allow 72 hours for refills to be completed.    Today you received the following chemotherapy and/or immunotherapy agents: Venofer (Iron)      To help prevent nausea and vomiting after your treatment, we encourage you to take your nausea medication as directed.  BELOW ARE SYMPTOMS THAT SHOULD BE REPORTED IMMEDIATELY: *FEVER GREATER THAN 100.4 F (38 C) OR HIGHER *CHILLS OR SWEATING *NAUSEA AND VOMITING THAT IS NOT CONTROLLED WITH YOUR NAUSEA MEDICATION *UNUSUAL SHORTNESS OF BREATH *UNUSUAL BRUISING OR BLEEDING *URINARY PROBLEMS (pain or burning when urinating, or frequent urination) *BOWEL PROBLEMS (unusual diarrhea, constipation, pain near the anus) TENDERNESS IN MOUTH AND THROAT WITH OR WITHOUT PRESENCE OF ULCERS (sore throat, sores in mouth, or a toothache) UNUSUAL RASH, SWELLING OR PAIN  UNUSUAL VAGINAL DISCHARGE OR ITCHING   Items with * indicate a potential emergency and should be followed up as soon as possible or go to the Emergency Department if any problems should occur.  Please show the CHEMOTHERAPY ALERT CARD or IMMUNOTHERAPY ALERT CARD at  check-in to the Emergency Department and triage nurse.  Should you have questions after your visit or need to cancel or reschedule your appointment, please contact [facility]  Dept: [phone]  and follow the prompts.  Office hours are 8:00 a.m. to 4:30 p.m. Monday - Friday. Please note that voicemails left after 4:00 p.m. may not be returned until the following business day.  We are closed weekends and major holidays. You have access to a nurse at all times for urgent questions. Please call the main number to the clinic Dept: [phone] and follow the prompts.   For any non-urgent questions, you may also contact your provider using MyChart. We now offer e-Visits for anyone 23 and older to request care online for non-urgent symptoms. For details visit [email or web address].   Also download the MyChart app! Go to the app store, search "MyChart", open the app, select [facility], and log in with your MyChart username and password.  Masks are optional in the cancer centers. If you would like for your care team to wear a mask while they are taking care of you, please let them know. For doctor visits, patients may have with them one support person who is at least 34 years old. At this time, visitors are not allowed in the infusion area.   Iron Sucrose Injection What is this medication? IRON SUCROSE (EYE ern SOO krose) treats low levels of iron (iron deficiency anemia) in people with kidney disease. Iron is a mineral that plays an important role in making red blood cells, which carry oxygen from your lungs to the  rest of your body. This medicine may be used for other purposes; ask your health care provider or pharmacist if you have questions. COMMON BRAND NAME(S): Venofer What should I tell my care team before I take this medication? They need to know if you have any of these conditions: Anemia not caused by low iron levels Heart disease High levels of iron in the  blood Kidney disease Liver disease An unusual or allergic reaction to iron, other medications, foods, dyes, or preservatives Pregnant or trying to get pregnant Breast-feeding How should I use this medication? This medication is for infusion into a vein. It is given in a hospital or clinic setting. Talk to your care team about the use of this medication in children. While this medication may be prescribed for children as young as 2 years for selected conditions, precautions do apply. Overdosage: If you think you have taken too much of this medicine contact a poison control center or emergency room at once. NOTE: This medicine is only for you. Do not share this medicine with others. What if I miss a dose? It is important not to miss your dose. Call your care team if you are unable to keep an appointment. What may interact with this medication? Do not take this medication with any of the following: Deferoxamine Dimercaprol Other iron products This medication may also interact with the following: Chloramphenicol Deferasirox This list may not describe all possible interactions. Give your health care provider a list of all the medicines, herbs, non-prescription drugs, or dietary supplements you use. Also tell them if you smoke, drink alcohol, or use illegal drugs. Some items may interact with your medicine. What should I watch for while using this medication? Visit your care team regularly. Tell your care team if your symptoms do not start to get better or if they get worse. You may need blood work done while you are taking this medication. You may need to follow a special diet. Talk to your care team. Foods that contain iron include: whole grains/cereals, dried fruits, beans, or peas, leafy green vegetables, and organ meats (liver, kidney). What side effects may I notice from receiving this medication? Side effects that you should report to your care team as soon as possible: Allergic  reactions--skin rash, itching, hives, swelling of the face, lips, tongue, or throat Low blood pressure--dizziness, feeling faint or lightheaded, blurry vision Shortness of breath Side effects that usually do not require medical attention (report to your care team if they continue or are bothersome): Flushing Headache Joint pain Muscle pain Nausea Pain, redness, or irritation at injection site This list may not describe all possible side effects. Call your doctor for medical advice about side effects. You may report side effects to FDA at 1-800-FDA-1088. Where should I keep my medication? This medication is given in a hospital or clinic and will not be stored at home. NOTE: This sheet is a summary. It may not cover all possible information. If you have questions about this medicine, talk to your doctor, pharmacist, or health care provider.  2023 Elsevier/Gold Standard (2020-06-12 00:00:00)

## 2021-08-09 NOTE — Progress Notes (Signed)
Patient stayed for her 30 minute post-iron observation. Vitals stable, no complaints noted. Ambulatory to lobby.

## 2021-08-12 ENCOUNTER — Ambulatory Visit (INDEPENDENT_AMBULATORY_CARE_PROVIDER_SITE_OTHER): Payer: Medicaid Other

## 2021-08-12 VITALS — BP 85/53 | HR 75 | Temp 98.8°F | Resp 16 | Ht 59.0 in | Wt 101.6 lb

## 2021-08-12 DIAGNOSIS — K5100 Ulcerative (chronic) pancolitis without complications: Secondary | ICD-10-CM | POA: Diagnosis not present

## 2021-08-12 MED ORDER — ACETAMINOPHEN 325 MG PO TABS
650.0000 mg | ORAL_TABLET | Freq: Once | ORAL | Status: AC
  Administered 2021-08-12: 650 mg via ORAL
  Filled 2021-08-12: qty 2

## 2021-08-12 MED ORDER — METHYLPREDNISOLONE SODIUM SUCC 40 MG IJ SOLR: 40.0000 mg | Freq: Once | INTRAMUSCULAR | Status: DC

## 2021-08-12 MED ORDER — SODIUM CHLORIDE 0.9 % IV SOLN
5.0000 mg/kg | Freq: Once | INTRAVENOUS | Status: AC
  Administered 2021-08-12: 200 mg via INTRAVENOUS
  Filled 2021-08-12: qty 20

## 2021-08-12 MED ORDER — DIPHENHYDRAMINE HCL 25 MG PO CAPS
25.0000 mg | ORAL_CAPSULE | Freq: Once | ORAL | Status: AC
  Administered 2021-08-12: 25 mg via ORAL
  Filled 2021-08-12: qty 1

## 2021-08-12 NOTE — Progress Notes (Signed)
Diagnosis: Crohn's Disease  Provider:  Chilton Greathouse, MD  Procedure: Infusion  IV Type: Peripheral, IV Location: L Antecubital  Remicade (Infliximab), Dose: 200 mg  Infusion Start Time: 1043  Infusion Stop Time: 1255  Post Infusion IV Care: Peripheral IV Discontinued  Discharge: Condition: Good, Destination: Home . AVS provided to patient.   Performed by:  Loney Hering, LPN

## 2021-08-16 ENCOUNTER — Other Ambulatory Visit: Payer: Self-pay

## 2021-08-16 ENCOUNTER — Inpatient Hospital Stay: Payer: Medicaid Other

## 2021-08-16 VITALS — BP 94/57 | HR 61 | Temp 98.7°F | Resp 16

## 2021-08-16 DIAGNOSIS — K5190 Ulcerative colitis, unspecified, without complications: Secondary | ICD-10-CM | POA: Diagnosis not present

## 2021-08-16 DIAGNOSIS — Z79620 Long term (current) use of immunosuppressive biologic: Secondary | ICD-10-CM | POA: Diagnosis not present

## 2021-08-16 DIAGNOSIS — D509 Iron deficiency anemia, unspecified: Secondary | ICD-10-CM

## 2021-08-16 MED ORDER — SODIUM CHLORIDE 0.9 % IV SOLN: Freq: Once | INTRAVENOUS | Status: AC

## 2021-08-16 MED ORDER — SODIUM CHLORIDE 0.9 % IV SOLN
200.0000 mg | Freq: Once | INTRAVENOUS | Status: AC
  Administered 2021-08-16: 200 mg via INTRAVENOUS
  Filled 2021-08-16: qty 200

## 2021-08-16 NOTE — Patient Instructions (Signed)

## 2021-08-23 ENCOUNTER — Other Ambulatory Visit: Payer: Self-pay

## 2021-08-23 ENCOUNTER — Inpatient Hospital Stay: Payer: Medicaid Other

## 2021-08-23 VITALS — BP 93/48 | HR 66 | Temp 98.6°F | Resp 16

## 2021-08-23 DIAGNOSIS — D509 Iron deficiency anemia, unspecified: Secondary | ICD-10-CM

## 2021-08-23 DIAGNOSIS — Z79620 Long term (current) use of immunosuppressive biologic: Secondary | ICD-10-CM | POA: Diagnosis not present

## 2021-08-23 DIAGNOSIS — K5190 Ulcerative colitis, unspecified, without complications: Secondary | ICD-10-CM | POA: Diagnosis not present

## 2021-08-23 MED ORDER — SODIUM CHLORIDE 0.9 % IV SOLN
200.0000 mg | Freq: Once | INTRAVENOUS | Status: AC
  Administered 2021-08-23: 200 mg via INTRAVENOUS
  Filled 2021-08-23: qty 200

## 2021-08-23 MED ORDER — SODIUM CHLORIDE 0.9 % IV SOLN: Freq: Once | INTRAVENOUS | Status: AC

## 2021-08-23 NOTE — Progress Notes (Signed)
Patient declined to stay for 30 minute post-iron observation. Vitals stable, ambulatory to lobby. 

## 2021-08-23 NOTE — Patient Instructions (Signed)

## 2021-08-30 ENCOUNTER — Other Ambulatory Visit: Payer: Self-pay

## 2021-08-30 ENCOUNTER — Inpatient Hospital Stay: Payer: Medicaid Other

## 2021-08-30 VITALS — BP 94/52 | HR 60 | Temp 98.4°F | Resp 18

## 2021-08-30 DIAGNOSIS — D509 Iron deficiency anemia, unspecified: Secondary | ICD-10-CM | POA: Diagnosis not present

## 2021-08-30 DIAGNOSIS — K5190 Ulcerative colitis, unspecified, without complications: Secondary | ICD-10-CM | POA: Diagnosis not present

## 2021-08-30 DIAGNOSIS — Z79620 Long term (current) use of immunosuppressive biologic: Secondary | ICD-10-CM | POA: Diagnosis not present

## 2021-08-30 MED ORDER — SODIUM CHLORIDE 0.9 % IV SOLN
200.0000 mg | Freq: Once | INTRAVENOUS | Status: AC
  Administered 2021-08-30: 200 mg via INTRAVENOUS
  Filled 2021-08-30: qty 200

## 2021-08-30 MED ORDER — SODIUM CHLORIDE 0.9 % IV SOLN: Freq: Once | INTRAVENOUS | Status: AC

## 2021-08-30 NOTE — Progress Notes (Signed)
Pt declined to stay for 30 minute wait post Venofer infusion. Pt monitored for 15 minutes due to low BP (see flowsheets). Pt BP improved back to baseline at time of discharge. Pt declined chest pain/SOB, dizziness, light headedness, or any additional symptoms. No complaints at time of discharge.

## 2021-08-30 NOTE — Patient Instructions (Signed)

## 2021-08-30 NOTE — Progress Notes (Signed)
Diagnosis: Crohn's Disease  Provider:  Chilton Greathouse, MD  Procedure: Infusion  IV Type: Peripheral, IV Location: L Antecubital  Remicade (Infliximab), Dose: 200 mg  Infusion Start Time: 1122  Infusion Stop Time: 1346  Post Infusion IV Care: Observation period completed and Peripheral IV Discontinued  Discharge: Condition: Good, Destination: Home . AVS provided to patient.   Performed by:  Marchia Bond LPN

## 2021-09-06 ENCOUNTER — Inpatient Hospital Stay: Payer: Medicaid Other | Attending: Oncology

## 2021-09-06 VITALS — BP 96/59 | HR 63 | Temp 98.5°F | Resp 16

## 2021-09-06 DIAGNOSIS — D509 Iron deficiency anemia, unspecified: Secondary | ICD-10-CM | POA: Diagnosis present

## 2021-09-06 MED ORDER — SODIUM CHLORIDE 0.9 % IV SOLN
200.0000 mg | Freq: Once | INTRAVENOUS | Status: AC
  Administered 2021-09-06: 200 mg via INTRAVENOUS
  Filled 2021-09-06: qty 200

## 2021-09-06 MED ORDER — SODIUM CHLORIDE 0.9 % IV SOLN: Freq: Once | INTRAVENOUS | Status: AC

## 2021-09-06 NOTE — Patient Instructions (Signed)

## 2021-09-08 ENCOUNTER — Encounter: Payer: Self-pay | Admitting: Gastroenterology

## 2021-09-08 ENCOUNTER — Ambulatory Visit (INDEPENDENT_AMBULATORY_CARE_PROVIDER_SITE_OTHER): Payer: Medicaid Other | Admitting: Gastroenterology

## 2021-09-08 VITALS — BP 114/68 | HR 86 | Ht 59.0 in | Wt 105.0 lb

## 2021-09-08 DIAGNOSIS — K51011 Ulcerative (chronic) pancolitis with rectal bleeding: Secondary | ICD-10-CM

## 2021-09-08 DIAGNOSIS — D649 Anemia, unspecified: Secondary | ICD-10-CM

## 2021-09-08 DIAGNOSIS — D500 Iron deficiency anemia secondary to blood loss (chronic): Secondary | ICD-10-CM

## 2021-09-08 MED ORDER — PREDNISONE 5 MG PO TABS: ORAL_TABLET | ORAL | 0 refills | Status: AC

## 2021-09-08 NOTE — Progress Notes (Unsigned)
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  Was treated with prednisone and she reports having clinical improvement during the 4 weeks that she took this medication.   - 12/2019:  normal/negative QuantiFERON gold.  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 HBsAg-. - She never followed up with Dr. Alessandra Bevels at [place], and transition to "herbal remedies". - 12/2020: Hospital admission with UC flare.  Albumin 1.4, H/H 10/30, PLT 615.  Treated with prednisone with plan to transition to biologic therapy as outpatient (did not want to start neuromodulator due to intermediate TPMT level).  CRP 4.8, ESR 64, GI PCR panel negative - 01/2021: Follow-up with [facility].  Had planned to initiate Remicade, but her insurance changed and can no longer follow with [facility] - 03/04/2021: CT A/P: Marked diffuse colorectal wall thickening and mucosal hyperenhancement with loss of haustral folds and slight haziness of pericolonic fat worse compared with 12/2019 c/w UC pancolitis - 07/14/2021: Establish outpatient care with [facility].  Was having breakthrough symptoms at prednisone 20 mg/day with up to 10 BM/day, rectal bleeding, diarrhea.  QuantiFERON gold negative.  ESR 82, CRP 2.4, H/H 7.5/24 with MCV/RDW 56/19, ferritin 12.7, iron 13, TIBC 344, sat 3.8%.  B12 364, folate 7.8.  Was referred for IV iron, initiating approval for Remicade - 08/04/2021: Evaluation in the Hematology clinic.  Started IV Venofer   Endoscopic History: - Colonoscopy (12/2019, Dr. 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  Normal TI  HPI:     Patient is a 36 y.o. female presenting to the Gastroenterology Clinic for follow-up.  Last seen on 07/14/2021 by Alonza Bogus to establish GI care.  Diagnosed with IDA (referred for IV iron), elevated inflammatory  markers, QuantiFERON gold negative, started on Remicade.  Today, states she completed IV venofer x5, with plan for repat labs and f/u in the Heme clinic in 6 months.   Started Remicade and completed loading phase, with repeat infusion tomorrow. Feeling much better, with only 1 BM/day. No nocturnal stools. Has had some loose stools over the last couple days, but still 1 BM/day. Still occasional BRBPR, but improved. Still taking Prednisone 20 mg/day. Will wean further today.   Back to working again.   Review of systems:     No chest pain, no SOB, no fevers, no urinary sx   Past Medical History:  Diagnosis Date   Anemia    Hypokalemia 12/09/2019   Hyponatremia 12/11/2019   Pre-diabetes    Ulcerative colitis ([place])     Patient's surgical history, family medical history, social history, medications and allergies were all reviewed in Epic    Current Outpatient Medications  Medication Sig Dispense Refill   loperamide (IMODIUM A-D) 2 MG tablet Take 1 tablet (2 mg total) by mouth 4 (four) times daily as needed for diarrhea or loose stools. 30 tablet 0   metoCLOPramide (REGLAN) 10 MG tablet Take 10 mg by mouth every 6 (six) hours as needed for nausea.     ondansetron (ZOFRAN ODT) 4 MG disintegrating tablet Take 1 tablet (4 mg total) by mouth every 8 (eight) hours as needed for nausea or vomiting. 30 tablet 0   predniSONE (DELTASONE) 10 MG tablet TAKE 4 TABLETS BY MOUTH ONCE DAILY FOR 1 WEEK, THEN TAKE 3 TABS DAILY FOR 1 WEEK, THEN 2 TABS DAILY FOR 1 WEEK, THEN TAKE 1  TABLET DAILY     Probiotic Product (PROBIOTIC BLEND) CAPS Take 1-2 capsules by mouth daily. (Patient not taking: Reported on 07/14/2021) 60 capsule 1   No current facility-administered medications for this visit.    Physical Exam:     BP 114/68   Pulse 86   Ht 4' 11" (1.499 m)   Wt 105 lb (47.6 kg)   LMP  (LMP Unknown)   BMI 21.21 kg/m   GENERAL:  Pleasant female in NAD PSYCH: : Cooperative, normal  affect Musculoskeletal:  Normal muscle tone, normal strength NEURO: Alert and oriented x 3, no focal neurologic deficits   IMPRESSION and PLAN:    1) Ulcerative Colitis 36 year old female with ulcerative pancolitis c/b iron deficiency anemia.  Good response to Remicade.  Some mild symptoms in the last couple of days, but scheduled for repeat infusion tomorrow.  Discussed benefit of proactive serologic monitoring which will pursue as below.  - Check CRP, ESR, fecal calprotectin in 7 weeks (1 week prior to next Remicade infusion) - Start prednisone wean.  Prescribed 20 mg/day and weaning by 5 mg every 7 days until complete - Evaluate for breakthrough symptoms which may necessitate shortening Remicade, increasing dose, or changing therapy - Health maintenance at follow-up - DEXA scan 6 months after completion of steroids - Tentative plan for repeat colonoscopy in 6-12 months depending on clinical response to Remicade maintenance  2) Iron deficiency anemia - Secondary to Ulcerative Colitis.  Has completed IV iron - Continue follow-up in the Hematology clinic with repeat CBC as planned  - RTC 3 months or sooner prn  I spent 35 minutes of time, including in depth chart review, independent review of results as outlined above, communicating results with the patient directly, face-to-face time with the patient, coordinating care, and ordering studies and medications as appropriate, and documentation.          Lavena Bullion ,DO, FACG 09/08/2021, 9:56 AM

## 2021-09-08 NOTE — Patient Instructions (Addendum)
_______________________________________________________  If you are age 34 or younger, your body mass index should be between 19-25. Your Body mass index is 21.21 kg/m. If this is out of the aformentioned range listed, please consider follow up with your Primary Care Provider.   ________________________________________________________  The [facility] providers would like to encourage you to use [facility] to communicate with providers for non-urgent requests or questions.  Due to long hold times on the telephone, sending your provider a message by [facility] may be a faster and more efficient way to get a response.  Please allow 48 business hours for a response.  Please remember that this is for non-urgent requests.  _______________________________________________________   Due to recent changes in healthcare laws, you may see the results of your imaging and laboratory studies on MyChart before your provider has had a chance to review them.  We understand that in some cases there may be results that are confusing or concerning to you. Not all laboratory results come back in the same time frame and the provider may be waiting for multiple results in order to interpret others.  Please give [place] 48 hours in order for your provider to thoroughly review all the results before contacting the office for clarification of your results.   Your provider has requested that you go to the basement level for lab work in 7 weeks. Press "B" on the elevator. The lab is located at the first door on the left as you exit the elevator.   We have sent the following medications to your pharmacy for you to pick up at your convenience: Prednisone  Follow up in 3 months.   Thank you for choosing me and [facility].  Vito Cirigliano, D.O.

## 2021-09-09 ENCOUNTER — Ambulatory Visit (INDEPENDENT_AMBULATORY_CARE_PROVIDER_SITE_OTHER): Payer: Medicaid Other

## 2021-09-09 VITALS — BP 90/51 | HR 68 | Temp 99.2°F | Resp 16 | Ht 59.0 in | Wt 105.4 lb

## 2021-09-09 DIAGNOSIS — K5100 Ulcerative (chronic) pancolitis without complications: Secondary | ICD-10-CM | POA: Diagnosis not present

## 2021-09-09 MED ORDER — SODIUM CHLORIDE 0.9 % IV SOLN
5.0000 mg/kg | Freq: Once | INTRAVENOUS | Status: AC
  Administered 2021-09-09: 200 mg via INTRAVENOUS
  Filled 2021-09-09: qty 20

## 2021-09-09 MED ORDER — ACETAMINOPHEN 325 MG PO TABS
650.0000 mg | ORAL_TABLET | Freq: Once | ORAL | Status: AC
  Administered 2021-09-09: 650 mg via ORAL
  Filled 2021-09-09: qty 2

## 2021-09-09 MED ORDER — METHYLPREDNISOLONE SODIUM SUCC 40 MG IJ SOLR: 40.0000 mg | Freq: Once | INTRAMUSCULAR | Status: DC

## 2021-09-09 MED ORDER — DIPHENHYDRAMINE HCL 25 MG PO CAPS
25.0000 mg | ORAL_CAPSULE | Freq: Once | ORAL | Status: AC
  Administered 2021-09-09: 25 mg via ORAL
  Filled 2021-09-09: qty 1

## 2021-09-09 NOTE — Progress Notes (Signed)
Diagnosis: Crohn's Disease  Provider:  Chilton Greathouse, MD  Procedure: Infusion  IV Type: Peripheral, IV Location: R Antecubital  Remicade (Infliximab), Dose: 200 mg  Infusion Start Time: 0958  Infusion Stop Time: 1228  Post Infusion IV Care: Peripheral IV Discontinued  Discharge: Condition: Good, Destination: Home . AVS provided to patient.   Performed by:  Nat Math, RN

## 2021-09-27 ENCOUNTER — Encounter: Payer: Self-pay | Admitting: Gastroenterology

## 2021-09-27 NOTE — Telephone Encounter (Signed)
When did this flare start?  Is she also having bloody stools?  How many stools per day?  I believe it has been 3 weeks or so since her last infusion.  Please check ESR/CRP and GI PCR panel now.  Ok to use Imodium for loose stools while awaiting results.  Also planning to check CRP, ESR, calprotectin, along with infliximab antibody and trough level 1 week prior to next infusion (all of these labs have already been ordered).  Please make sure she is aware and planning to head to the lab 1 week prior to next infusion as this may dictate what we do with therapy and determine whether or not the infliximab is helping.

## 2021-10-21 NOTE — Telephone Encounter (Signed)
Pt states that symptoms started 1 week after last infusion. Last remicade infusion was 09/09/21. Pt has had increased urgency since a week after infusion as well. Pt scheduled for appt on 10/22/21 at 3:40 pm.

## 2021-10-22 ENCOUNTER — Ambulatory Visit (INDEPENDENT_AMBULATORY_CARE_PROVIDER_SITE_OTHER): Payer: Medicaid Other | Admitting: Gastroenterology

## 2021-10-22 ENCOUNTER — Encounter: Payer: Self-pay | Admitting: Gastroenterology

## 2021-10-22 ENCOUNTER — Other Ambulatory Visit (INDEPENDENT_AMBULATORY_CARE_PROVIDER_SITE_OTHER): Payer: Medicaid Other

## 2021-10-22 VITALS — BP 110/62 | HR 61 | Ht 59.0 in | Wt 113.0 lb

## 2021-10-22 DIAGNOSIS — K51011 Ulcerative (chronic) pancolitis with rectal bleeding: Secondary | ICD-10-CM

## 2021-10-22 DIAGNOSIS — R197 Diarrhea, unspecified: Secondary | ICD-10-CM

## 2021-10-22 DIAGNOSIS — D500 Iron deficiency anemia secondary to blood loss (chronic): Secondary | ICD-10-CM | POA: Diagnosis not present

## 2021-10-22 DIAGNOSIS — R152 Fecal urgency: Secondary | ICD-10-CM

## 2021-10-22 DIAGNOSIS — K625 Hemorrhage of anus and rectum: Secondary | ICD-10-CM

## 2021-10-22 LAB — CBC
HCT: 35.6 % — ABNORMAL LOW (ref 36.0–46.0)
Hemoglobin: 11.9 g/dL — ABNORMAL LOW (ref 12.0–15.0)
MCHC: 33.3 g/dL (ref 30.0–36.0)
MCV: 73.2 fl — ABNORMAL LOW (ref 78.0–100.0)
Platelets: 380 10*3/uL (ref 150.0–400.0)
RBC: 4.86 Mil/uL (ref 3.87–5.11)
RDW: 22.8 % — ABNORMAL HIGH (ref 11.5–15.5)
WBC: 10.4 10*3/uL (ref 4.0–10.5)

## 2021-10-22 LAB — C-REACTIVE PROTEIN: CRP: <1 mg/dL (ref 0.5–20.0)

## 2021-10-22 LAB — SEDIMENTATION RATE: Sed Rate: 32 mm/hr — ABNORMAL HIGH (ref 0–20)

## 2021-10-22 MED ORDER — PREDNISONE 10 MG PO TABS: ORAL_TABLET | ORAL | 0 refills | Status: AC

## 2021-10-22 NOTE — Progress Notes (Signed)
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  Was treated with prednisone and she reports having clinical improvement during the 4 weeks that she took this medication.   - 12/2019:  normal/negative QuantiFERON gold.  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 HBsAg-. - She never followed up with Dr. Alessandra Bevels at [place], and transitioned to "herbal remedies". - 12/2020: Hospital admission with UC flare.  Albumin 1.4, H/H 10/30, PLT 615.  Treated with prednisone with plan to transition to biologic therapy as outpatient (did not want to start immunomodulator due to intermediate TPMT level).  CRP 4.8, ESR 64, GI PCR panel negative - 01/2021: Follow-up with [facility].  Had planned to initiate Remicade, but her insurance changed and can no longer follow with [facility] - 03/04/2021: CT A/P: Marked diffuse colorectal wall thickening and mucosal hyperenhancement with loss of haustral folds and slight haziness of pericolonic fat worse compared with 12/2019 c/w UC pancolitis - 07/14/2021: Establish outpatient care with [facility].  Was having breakthrough symptoms at prednisone 20 mg/day with up to 10 BM/day, rectal bleeding, diarrhea.  QuantiFERON gold negative.  ESR 82, CRP 2.4, H/H 7.5/24 with MCV/RDW 56/19, ferritin 12.7, iron 13, TIBC 344, sat 3.8%.  B12 364, folate 7.8.  Was referred for IV iron, initiating approval for Remicade - 08/04/2021: Evaluation in the Hematology clinic.  Started IV Venofer - 09/08/2021: GI follow-up.  Reported clinical improvement since initiation of Remicade with 1 BM/day, occasional BRBPR but improved.  Was still on prednisone 20 mg/day; started weaning.       Endoscopic History: - Colonoscopy (12/2019, Dr. 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  Normal TI  HPI:     Patient is  a 36 y.o. female presenting to the Gastroenterology Clinic for follow-up.  Last seen by me on 09/08/2021.  Was feeling much better since starting Remicade.  Started weaning prednisone with plan for repeat labs prior to next Remicade infusion (ESR, CRP, infliximab trough/antibody).  Last infusion was 09/09/2021.  Started having flareup of symptoms mid August, despite having Remicade infusion on 09/09/2021.  Ordered labs (inflammatory markers, stool studies), but not yet completed by patient.  Has since completed prednisone taper.  Increasing urgency over last 2 weeks. +nocturnal stools. Started taking some "leftover" Prednisone 20 mg/day with some improvement.  Has since run out of prednisone again about and symptoms recurring.    Review of systems:     No chest pain, no SOB, no fevers, no urinary sx   Past Medical History:  Diagnosis Date   Anemia    Hypokalemia 12/09/2019   Hyponatremia 12/11/2019   Pre-diabetes    Ulcerative colitis ([place])     Patient's surgical history, family medical history, social history, medications and allergies were all reviewed in Epic    Current Outpatient Medications  Medication Sig Dispense Refill   loperamide (IMODIUM A-D) 2 MG tablet Take 1 tablet (2 mg total) by mouth 4 (four) times daily as needed for diarrhea or loose stools. 30 tablet 0   metoCLOPramide (REGLAN) 10 MG tablet Take 10 mg by mouth every 6 (six) hours as needed for nausea.     ondansetron (ZOFRAN ODT) 4 MG disintegrating tablet Take 1 tablet (4 mg total) by mouth every 8 (eight) hours as needed for nausea or vomiting. 30 tablet 0   predniSONE (STERAPRED UNI-PAK 21  TAB) 5 MG (21) TBPK tablet Daily     Probiotic Product (PROBIOTIC BLEND) CAPS Take 1-2 capsules by mouth daily. (Patient not taking: Reported on 07/14/2021) 60 capsule 1   No current facility-administered medications for this visit.    Physical Exam:     BP 110/62   Pulse 61   Ht '4\' 11"'  (1.499 m)   Wt 113 lb (51.3 kg)   LMP  10/25/2020   BMI 22.82 kg/m   GENERAL:  Pleasant female in NAD PSYCH: : Cooperative, normal affect Musculoskeletal:  Normal muscle tone, normal strength NEURO: Alert and oriented x 3, no focal neurologic deficits   IMPRESSION and PLAN:    1) Ulcerative Colitis 36 year old female with steroid responsive ulcerative pancolitis c/b iron deficiency anemia.  Had initial improvement with Remicade induction, but symptoms now seemingly more c/w Remicade failure.  Potentially developed antibodies to Remicade sometime after induction, and now not efficacious.  Discussed treatment strategy at length today with the following plan:  - Check CBC, ESR, CRP, fecal calprotectin today - Check GI PCR panel - Discussed role/utility of checking infliximab antibody and trough level.  Checking now would certainly not be a true trough, but if drug level undetectable and or presence of antibody, I think that would certainly obviate need for medication change.  Will need to first check on potential cost of this lab per discussion with patient.  If cost prohibitive, plan to make empiric medication change based on symptoms - Discussed changing to Entyvio vs Rinvoq.  We discussed the risks, benefits, ADR profiles of all available UC medications.  Patient would like to read about each of these and make an informed decision - Reasonable to provide Rx for prednisone given current symptomatology.  Prednisone 30 mg x 2 weeks, then reduce by 5 mg every 7 days - DEXA scan 6 months after completion of steroids - Continue health maintenance checkup at follow-up - Directed to [email or web address]  2) Hematochezia 3) Nocturnal stools 4) Fecal urgency -All secondary to Ulcerative Colitis.  Management as above  5) Iron deficiency anemia - Secondary to Ulcerative Colitis.  Has completed IV iron - Continue follow-up in the Hematology clinic with repeat CBC as planned   - RTC 6 weeks or sooner      I spent 40 minutes of time,  including in depth chart review, independent review of results as outlined above, communicating results with the patient directly, face-to-face time with the patient, coordinating care, ordering studies and medications as appropriate, and documentation.   Lavena Bullion ,DO, FACG 10/22/2021, 3:52 PM

## 2021-10-22 NOTE — Patient Instructions (Addendum)
Try visiting [email or web address] for some valuable information regarding Ulcerative Colitis and treatment options.   We are considering starting Entyvio or Rinvoq. Once you had a chance to read about these medications, please let me know which one and we will discuss with insurance for approval.     If you are age 34 or younger, your body mass index should be between 19-25. Your Body mass index is 22.82 kg/m. If this is out of the aformentioned range listed, please consider follow up with your Primary Care Provider.   __________________________________________________________  The [facility] providers would like to encourage you to use [facility] to communicate with providers for non-urgent requests or questions.  Due to long hold times on the telephone, sending your provider a message by [facility] may be a faster and more efficient way to get a response.  Please allow 48 business hours for a response.  Please remember that this is for non-urgent requests.   Due to recent changes in healthcare laws, you may see the results of your imaging and laboratory studies on MyChart before your provider has had a chance to review them.  We understand that in some cases there may be results that are confusing or concerning to you. Not all laboratory results come back in the same time frame and the provider may be waiting for multiple results in order to interpret others.  Please give [place] 48 hours in order for your provider to thoroughly review all the results before contacting the office for clarification of your results.   We have sent the following medications to your pharmacy for you to pick up at your convenience: Prednisone  Your provider has requested that you go to the basement level for lab work before leaving today. Press "B" on the elevator. The lab is located at the first door on the left as you exit the elevator.   You have been scheduled for an appointment with Dr. Bryan Lemma on 12/07/21 at 11:20am. Please  arrive 10 minutes early for your appointment.   Thank you for choosing me and [facility].  Vito Cirigliano, D.O.

## 2021-11-09 ENCOUNTER — Telehealth: Payer: Self-pay | Admitting: Pharmacy Technician

## 2021-11-09 ENCOUNTER — Other Ambulatory Visit (HOSPITAL_COMMUNITY): Payer: Self-pay

## 2021-11-09 ENCOUNTER — Telehealth: Payer: Self-pay

## 2021-11-09 NOTE — Telephone Encounter (Signed)
Patient Advocate Encounter  Received notification that prior authorization for [facility] is required.   PA submitted on 10.10.23 Key [id number] Status is pending    Luciano Cutter, CPhT Patient Advocate Phone: [phone]

## 2021-11-09 NOTE — Telephone Encounter (Signed)
Pt called to see if she should keep remicade appointment on 10/12. Let pt know she can keep appointment on 10/12. Pt verbalized understanding, pt also stated she wanted to start Rinvoq.

## 2021-11-09 NOTE — Telephone Encounter (Signed)
PA has been submitted and telephone encounter has been created 

## 2021-11-09 NOTE — Telephone Encounter (Signed)
Yes, keep appointment for Remicade as scheduled.  Please submit paperwork for authorization to start Rinvoq as follows: - Induction: 45 mg once daily for 8 weeks followed by maintenance therapy of 30 mg once daily

## 2021-11-10 ENCOUNTER — Encounter: Payer: Self-pay | Admitting: Gastroenterology

## 2021-11-10 ENCOUNTER — Encounter: Payer: Self-pay | Admitting: Oncology

## 2021-11-10 NOTE — Telephone Encounter (Signed)
Received a fax regarding Prior Authorization from [facility] for [facility]. Authorization has been DENIED because PT HAS NOT TRIED HUMIRA.

## 2021-11-10 NOTE — Telephone Encounter (Signed)
Can you please let this patient know that her insurance has denied Rinvoq as she has not tried Humira in the past.  If she is agreeable to starting Humira, please set up for Humira starter pack and maintenance therapy.

## 2021-11-10 NOTE — Telephone Encounter (Signed)
Pt stated she wanted to think about starting Humira and would give [place] a call back to let us know what she decides.

## 2021-11-11 ENCOUNTER — Ambulatory Visit (INDEPENDENT_AMBULATORY_CARE_PROVIDER_SITE_OTHER): Payer: Medicaid Other

## 2021-11-11 VITALS — BP 90/58 | HR 79 | Temp 98.7°F | Resp 16 | Ht 59.0 in | Wt 115.4 lb

## 2021-11-11 DIAGNOSIS — K5100 Ulcerative (chronic) pancolitis without complications: Secondary | ICD-10-CM

## 2021-11-11 MED ORDER — DIPHENHYDRAMINE HCL 25 MG PO CAPS
25.0000 mg | ORAL_CAPSULE | Freq: Once | ORAL | Status: AC
  Administered 2021-11-11: 25 mg via ORAL
  Filled 2021-11-11: qty 1

## 2021-11-11 MED ORDER — METHYLPREDNISOLONE SODIUM SUCC 40 MG IJ SOLR
40.0000 mg | Freq: Once | INTRAMUSCULAR | Status: AC
  Administered 2021-11-11: 40 mg via INTRAVENOUS
  Filled 2021-11-11: qty 1

## 2021-11-11 MED ORDER — ACETAMINOPHEN 325 MG PO TABS
650.0000 mg | ORAL_TABLET | Freq: Once | ORAL | Status: AC
  Administered 2021-11-11: 650 mg via ORAL
  Filled 2021-11-11: qty 2

## 2021-11-11 MED ORDER — SODIUM CHLORIDE 0.9 % IV SOLN
5.0000 mg/kg | Freq: Once | INTRAVENOUS | Status: AC
  Administered 2021-11-11: 300 mg via INTRAVENOUS
  Filled 2021-11-11: qty 30

## 2021-11-11 NOTE — Progress Notes (Signed)
Diagnosis: Crohn's Disease  Provider:  Marshell Garfinkel MD  Procedure: Infusion  IV Type: Peripheral, IV Location: L Antecubital  Remicade (Infliximab), Dose: 300 mg  Infusion Start Time: 1010  Infusion Stop Time: 1229  Post Infusion IV Care: Peripheral IV Discontinued  Discharge: Condition: Good, Destination: Home . AVS provided to patient.   Performed by:  Adelina Mings, LPN

## 2021-11-16 NOTE — Telephone Encounter (Signed)
Called pt to see if she wanted to proceed with humira. Pt stated she would call back at the end of the day with her decision.

## 2021-12-07 ENCOUNTER — Ambulatory Visit: Payer: Medicaid Other | Admitting: Gastroenterology

## 2021-12-11 ENCOUNTER — Telehealth: Payer: Self-pay | Admitting: Oncology

## 2021-12-11 NOTE — Telephone Encounter (Signed)
Called patient regarding upcoming January appointments and also informed patient regarding providers departure. Informed patient the new APP she is going to be seeing, patient would like to wait until next follow-up to schedule.

## 2022-01-06 ENCOUNTER — Ambulatory Visit (INDEPENDENT_AMBULATORY_CARE_PROVIDER_SITE_OTHER): Payer: Medicaid Other

## 2022-01-06 VITALS — BP 99/67 | HR 55 | Temp 98.5°F | Resp 16 | Ht 59.0 in | Wt 122.0 lb

## 2022-01-06 DIAGNOSIS — K5100 Ulcerative (chronic) pancolitis without complications: Secondary | ICD-10-CM

## 2022-01-06 MED ORDER — DIPHENHYDRAMINE HCL 25 MG PO CAPS
25.0000 mg | ORAL_CAPSULE | Freq: Once | ORAL | Status: AC
  Administered 2022-01-06: 25 mg via ORAL
  Filled 2022-01-06: qty 1

## 2022-01-06 MED ORDER — SODIUM CHLORIDE 0.9 % IV SOLN
5.0000 mg/kg | Freq: Once | INTRAVENOUS | Status: AC
  Administered 2022-01-06: 300 mg via INTRAVENOUS
  Filled 2022-01-06: qty 30

## 2022-01-06 MED ORDER — METHYLPREDNISOLONE SODIUM SUCC 40 MG IJ SOLR
40.0000 mg | Freq: Once | INTRAMUSCULAR | Status: AC
  Administered 2022-01-06: 40 mg via INTRAVENOUS
  Filled 2022-01-06: qty 1

## 2022-01-06 MED ORDER — ACETAMINOPHEN 325 MG PO TABS
650.0000 mg | ORAL_TABLET | Freq: Once | ORAL | Status: AC
  Administered 2022-01-06: 650 mg via ORAL
  Filled 2022-01-06: qty 2

## 2022-01-06 NOTE — Progress Notes (Signed)
Diagnosis: Iron Deficiency Anemia  Provider:  Chilton Greathouse MD  Procedure: Infusion  IV Type: Peripheral, IV Location: L Antecubital  Remicade (Infliximab), Dose: 300 mg  Infusion Start Time: 1007  Infusion Stop Time: 1220  Post Infusion IV Care: Peripheral IV Discontinued  Discharge: Condition: Good, Destination: Home . AVS provided to patient.   Performed by:  Loney Hering, LPN

## 2022-01-21 ENCOUNTER — Encounter: Payer: Self-pay | Admitting: Gastroenterology

## 2022-01-21 ENCOUNTER — Encounter: Payer: Self-pay | Admitting: Oncology

## 2022-02-03 ENCOUNTER — Inpatient Hospital Stay: Payer: Medicaid Other

## 2022-02-03 ENCOUNTER — Inpatient Hospital Stay: Payer: Medicaid Other | Admitting: Oncology

## 2022-02-11 ENCOUNTER — Inpatient Hospital Stay: Payer: Medicaid Other | Attending: Oncology

## 2022-02-11 ENCOUNTER — Inpatient Hospital Stay (HOSPITAL_BASED_OUTPATIENT_CLINIC_OR_DEPARTMENT_OTHER): Payer: Medicaid Other | Admitting: Oncology

## 2022-02-11 VITALS — BP 93/62 | HR 64 | Temp 97.8°F | Resp 16 | Ht 59.0 in | Wt 130.0 lb

## 2022-02-11 DIAGNOSIS — D509 Iron deficiency anemia, unspecified: Secondary | ICD-10-CM

## 2022-02-11 DIAGNOSIS — K922 Gastrointestinal hemorrhage, unspecified: Secondary | ICD-10-CM | POA: Insufficient documentation

## 2022-02-11 DIAGNOSIS — N920 Excessive and frequent menstruation with regular cycle: Secondary | ICD-10-CM | POA: Diagnosis not present

## 2022-02-11 DIAGNOSIS — K5190 Ulcerative colitis, unspecified, without complications: Secondary | ICD-10-CM | POA: Insufficient documentation

## 2022-02-11 DIAGNOSIS — D500 Iron deficiency anemia secondary to blood loss (chronic): Secondary | ICD-10-CM | POA: Diagnosis present

## 2022-02-11 LAB — CBC WITH DIFFERENTIAL (CANCER CENTER ONLY)
Abs Immature Granulocytes: 0.02 10*3/uL (ref 0.00–0.07)
Basophils Absolute: 0.1 10*3/uL (ref 0.0–0.1)
Basophils Relative: 1 %
Eosinophils Absolute: 0.1 10*3/uL (ref 0.0–0.5)
Eosinophils Relative: 1 %
HCT: 34.9 % — ABNORMAL LOW (ref 36.0–46.0)
Hemoglobin: 12.2 g/dL (ref 12.0–15.0)
Immature Granulocytes: 0 %
Lymphocytes Relative: 25 %
Lymphs Abs: 2.2 10*3/uL (ref 0.7–4.0)
MCH: 26.2 pg (ref 26.0–34.0)
MCHC: 35 g/dL (ref 30.0–36.0)
MCV: 75.1 fL — ABNORMAL LOW (ref 80.0–100.0)
Monocytes Absolute: 0.9 10*3/uL (ref 0.1–1.0)
Monocytes Relative: 10 %
Neutro Abs: 5.5 10*3/uL (ref 1.7–7.7)
Neutrophils Relative %: 63 %
Platelet Count: 444 10*3/uL — ABNORMAL HIGH (ref 150–400)
RBC: 4.65 MIL/uL (ref 3.87–5.11)
RDW: 13.6 % (ref 11.5–15.5)
WBC Count: 8.8 10*3/uL (ref 4.0–10.5)
nRBC: 0 % (ref 0.0–0.2)

## 2022-02-11 LAB — IRON AND IRON BINDING CAPACITY (CC-WL,HP ONLY)
Iron: 39 ug/dL (ref 28–170)
Saturation Ratios: 11 % (ref 10.4–31.8)
TIBC: 354 ug/dL (ref 250–450)
UIBC: 315 ug/dL (ref 148–442)

## 2022-02-11 LAB — FERRITIN: Ferritin: 11 ng/mL (ref 11–307)

## 2022-02-11 NOTE — Progress Notes (Signed)
Hematology and Oncology Follow Up Visit  [name] [id number] 05-May-1985 37 y.o. 02/11/2022 2:30 PM Pcp, NoFeng, Krista Blue, MD   Principle Diagnosis: 37 year old with iron deficiency anemia diagnosed in June 2023 related to colitis as well as chronic menstrual bleeding.   Prior Therapy: IV iron infusion in the form of Venofer for a total of 1000 mg completed in August 2023.  Current therapy: Under consideration for oral iron therapy.  Interim History: [name] returns today for a follow-up visit.  Since the last visit, she completed intravenous iron infusion without any complications.  He denies any nausea, vomiting or abdominal pain.  She denies any hematochezia or melena.  He does have a follow-up ulcerative colitis that at times has been exacerbated and currently under evaluation to change therapy.  Menstrual cycles remain regular.      Medications: I have reviewed the patient's current medications.  Current Outpatient Medications  Medication Sig Dispense Refill   loperamide (IMODIUM A-D) 2 MG tablet Take 1 tablet (2 mg total) by mouth 4 (four) times daily as needed for diarrhea or loose stools. 30 tablet 0   metoCLOPramide (REGLAN) 10 MG tablet Take 10 mg by mouth every 6 (six) hours as needed for nausea.     ondansetron (ZOFRAN ODT) 4 MG disintegrating tablet Take 1 tablet (4 mg total) by mouth every 8 (eight) hours as needed for nausea or vomiting. 30 tablet 0   Probiotic Product (PROBIOTIC BLEND) CAPS Take 1-2 capsules by mouth daily. (Patient not taking: Reported on 07/14/2021) 60 capsule 1   No current facility-administered medications for this visit.     Allergies: No Known Allergies    Physical Exam: Blood pressure 93/62, pulse 64, temperature 97.8 F (36.6 C), temperature source Temporal, resp. rate 16, height 4\' 11"  (1.499 m), weight 130 lb (59 kg), SpO2 100 %, unknown if currently breastfeeding. ECOG:     General appearance: Comfortable appearing without any  discomfort Head: Normocephalic without any trauma Oropharynx: Mucous membranes are moist and pink without any thrush or ulcers. Eyes: Pupils are equal and round reactive to light. Lymph nodes: No cervical, supraclavicular, inguinal or axillary lymphadenopathy.   Heart:regular rate and rhythm.  S1 and S2 without leg edema. Lung: Clear without any rhonchi or wheezes.  No dullness to percussion. Abdomin: Soft, nontender, nondistended with good bowel sounds.  No hepatosplenomegaly. Musculoskeletal: No joint deformity or effusion.  Full range of motion noted. Neurological: No deficits noted on motor, sensory and deep tendon reflex exam. Skin: No petechial rash or dryness.  Appeared moist.      Lab Results: Lab Results  Component Value Date   WBC 8.8 02/11/2022   HGB 12.2 02/11/2022   HCT 34.9 (L) 02/11/2022   MCV 75.1 (L) 02/11/2022   PLT 444 (H) 02/11/2022     Chemistry      Component Value Date/Time   NA 136 07/14/2021 1100   K 3.3 (L) 07/14/2021 1100   CL 103 07/14/2021 1100   CO2 29 07/14/2021 1100   BUN 9 07/14/2021 1100   CREATININE 0.63 07/14/2021 1100      Component Value Date/Time   CALCIUM 8.3 (L) 07/14/2021 1100   ALKPHOS 67 07/14/2021 1100   AST 9 07/14/2021 1100   ALT 6 07/14/2021 1100   BILITOT 0.3 07/14/2021 1100          Impression and Plan:   37 year old woman with:  1.  Iron deficiency anemia related to chronic GI blood losses from ulcerative colitis  and chronic menstrual bleeding documented in June 2023.     She has completed the IV iron infusion with normalization of her hemoglobin and iron studies are currently pending.  At this time I recommended oral iron maintenance therapy given her ongoing losses with repeat IV iron infusion as needed.   2.  Ulcerative colitis: She is under evaluation to change therapy follow-up under gastroenterology.   3.  Follow-up: In 6 months for follow-up evaluation.  Sooner if she needed IV iron infusion.   30   minutes were spent on this encounter.  The time was dedicated to updating status, treatment choices and outlining future plan of care review.   Amanda Button, MD 1/12/20242:30 PM

## 2022-02-18 IMAGING — CT CT ABD-PELV W/ CM
2 of 4 series · 15 of 46 positions shown, 17 images · IV contrast (APPLIED)
Comparison: None.

CLINICAL DATA: Acute nonlocalized upper abdominal pain right upper
quadrant abdominal pain, bloody stools

EXAM:
CT ABDOMEN AND PELVIS WITH CONTRAST
TECHNIQUE: Multidetector CT imaging of the abdomen and pelvis was performed
using the standard protocol following bolus administration of
intravenous contrast.
CONTRAST:  100mL OMNIPAQUE IOHEXOL 300 MG/ML  SOLN

[Series 3: abdomen 5.0 · axial · 0.75mm/px · z∈[-413,-53]mm · 12 of 84 slices shown, 14 images]
[im 6/84  soft-tissue]
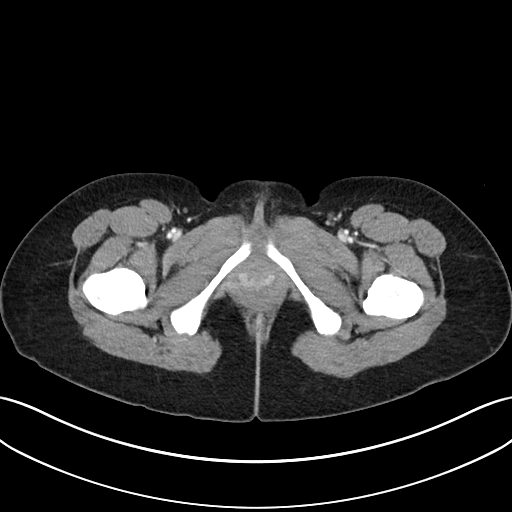
[im 6/84  bone]
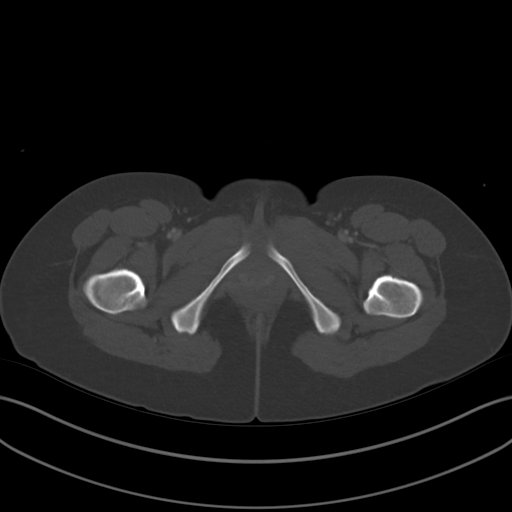
[im 12/84  soft-tissue]
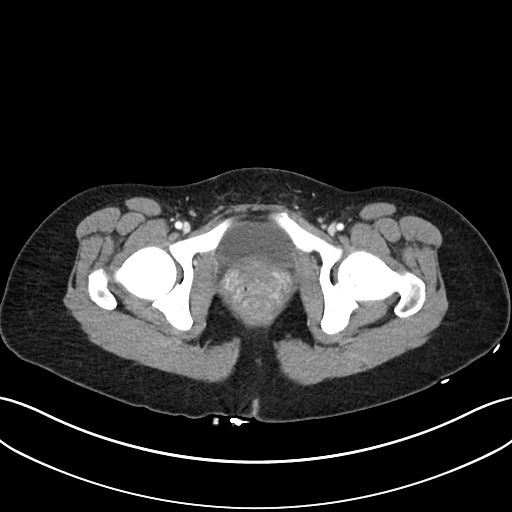
[im 17/84  soft-tissue]
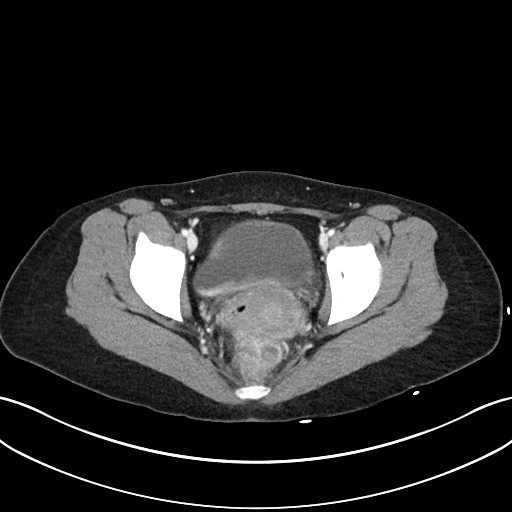
[im 28/84  soft-tissue]
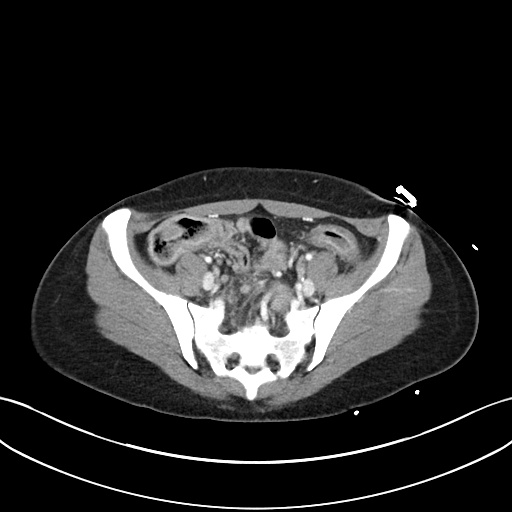
[im 34/84  soft-tissue]
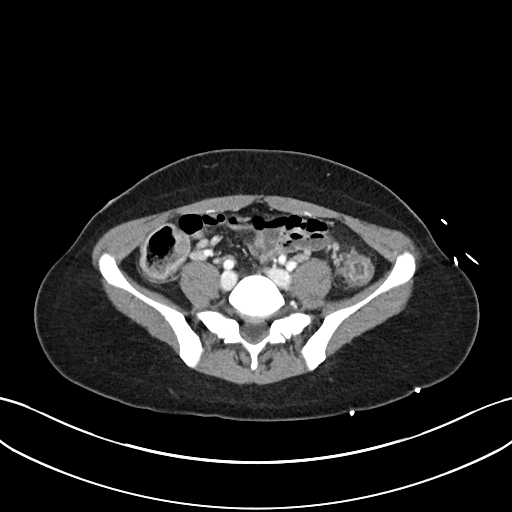
[im 39/84  soft-tissue]
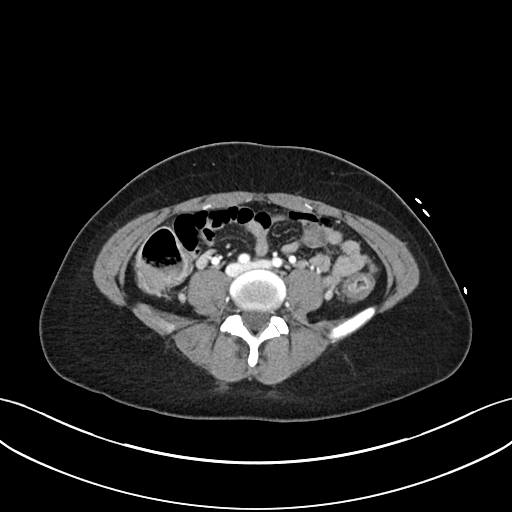
[im 45/84  soft-tissue]
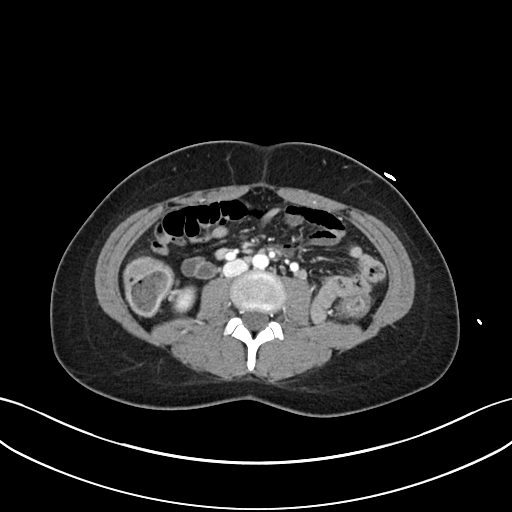
[im 50/84  soft-tissue]
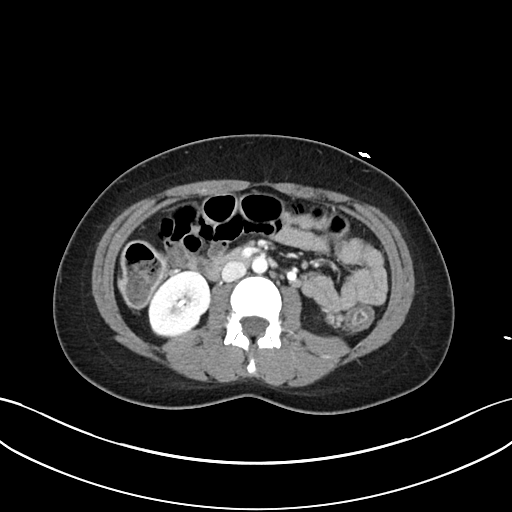
[im 56/84  soft-tissue]
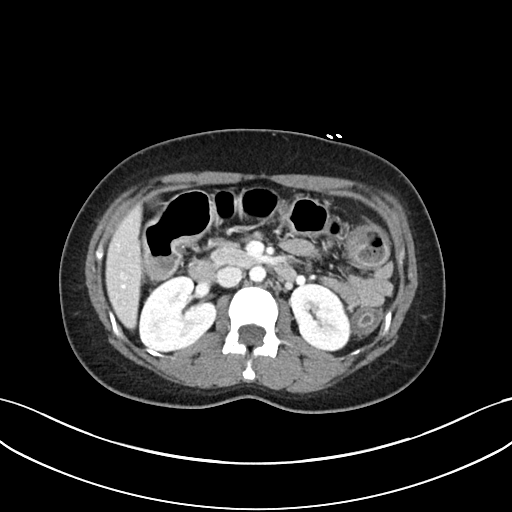
[im 56/84  bone]
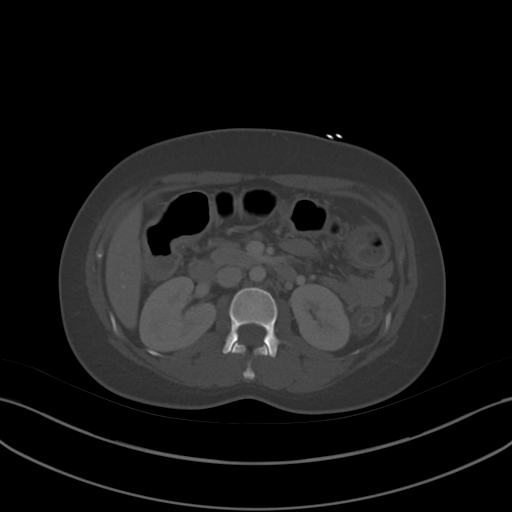
[im 67/84  soft-tissue]
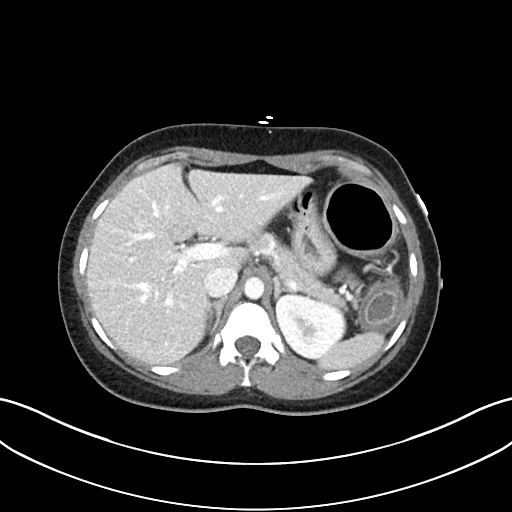
[im 72/84  soft-tissue]
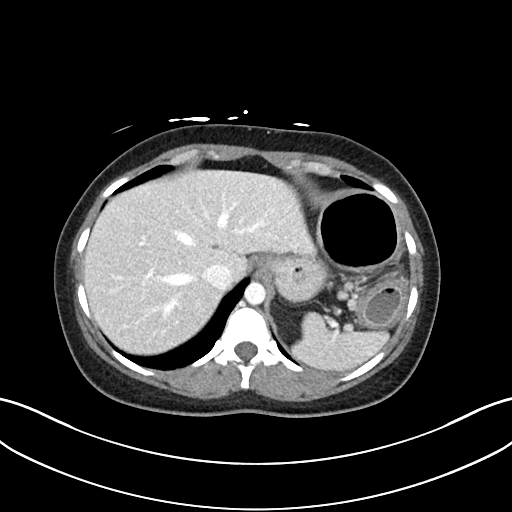
[im 78/84  soft-tissue]
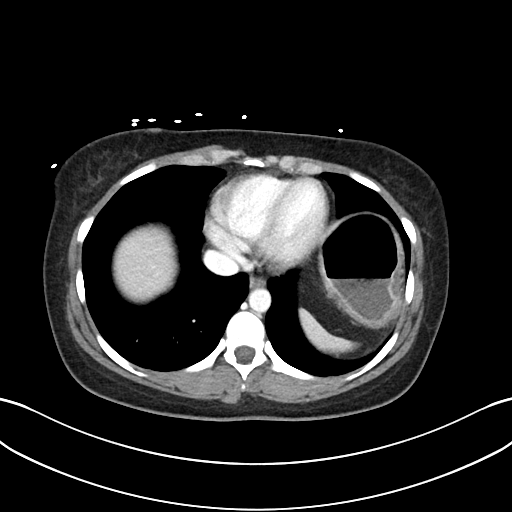

[Series 6: abdomen 3.0 mpr cor · coronal · 0.56mm/px · 3 of 79 slices shown]
[im 27/79  soft-tissue]
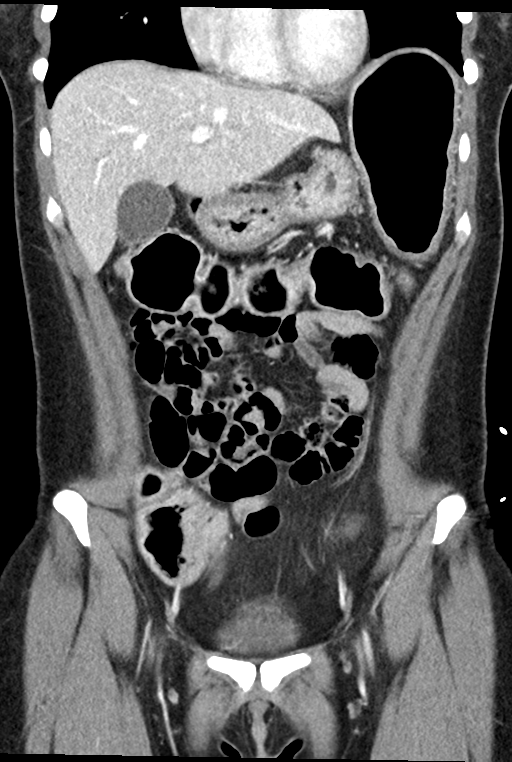
[im 35/79  soft-tissue]
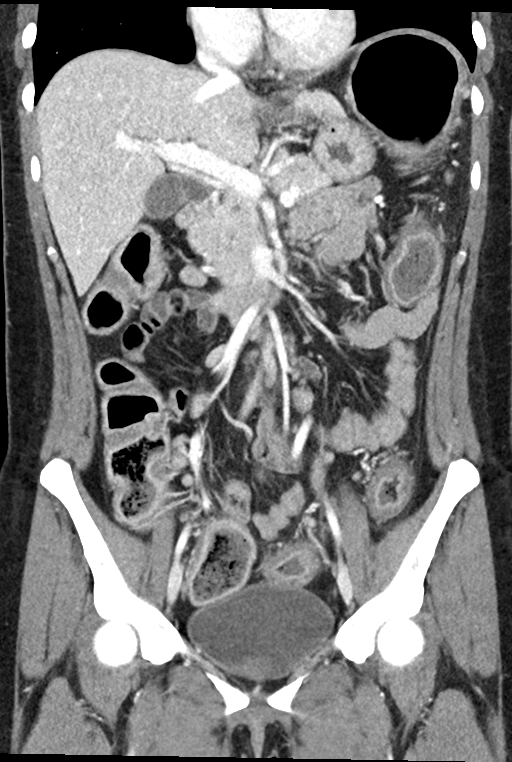
[im 44/79  soft-tissue]
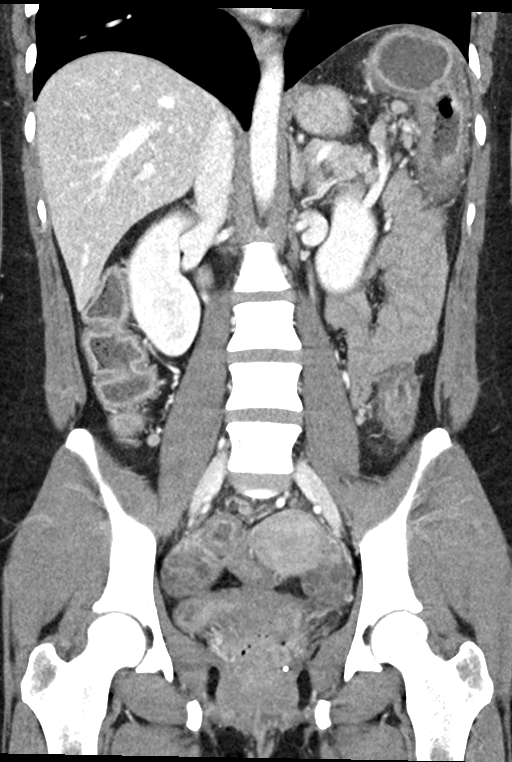

[15 of 46 positions shown; findings below may reference images not displayed]

FINDINGS: Lower chest: Lung bases are clear. Normal heart size. No pericardial
effusion.

Hepatobiliary: Tiny subcentimeter hypoattenuating focus in the
posterior right lobe liver too small to fully characterize on CT
imaging but statistically likely benign. ([DATE]). No worrisome focal
liver lesions. Smooth liver surface contour. Normal hepatic
attenuation.

Pancreas: No pancreatic ductal dilatation or surrounding
inflammatory changes.

Spleen: Normal in size. No concerning splenic lesions.

Adrenals/Urinary Tract: Normal adrenal glands. Kidneys are normally
located with symmetric enhancement. No suspicious renal lesion,
urolithiasis or hydronephrosis. Early excretion of contrast likely
reflecting excellent renal function with some dependently layering
contrast media within the urinary bladder. Urinary bladder otherwise
free of acute abnormality.

Stomach/Bowel: Distal esophagus, stomach and duodenal sweep are
unremarkable. No small bowel wall thickening or dilatation. No
evidence of obstruction. A normal appendix is visualized. There is
diffuse pancolonic edematous mural thickening with mucosal
hyperemia. No extraluminal gas or free fluid nor organized
collection or abscess. No pneumatosis or portal venous gas.

Vascular/Lymphatic: No significant vascular findings are present. No
enlarged abdominal or pelvic lymph nodes.

Reproductive: Anteverted uterus. Suspect a small dorsal right
uterine fibroid with some central hypoattenuation measuring
approximately 2 cm in size. No concerning adnexal lesions with
normal follicles bilaterally.

Other: No abdominopelvic free fluid or free gas. No bowel containing
hernias.

Musculoskeletal: No acute osseous abnormality or suspicious osseous
lesion.
IMPRESSION: 1. Diffuse pancolonic edematous mural thickening with mucosal
hyperemia, consistent with colitis, either infectious or
inflammatory in etiology. No evidence of perforation or abscess
formation.
2. Suspect a small dorsal uterine fibroid. Could consider outpatient
evaluation with sonography as clinically warranted.

## 2022-02-18 IMAGING — CR DG CHEST 2V
2 series · 2 of 2 positions shown · non-contrast
Comparison: None.

CLINICAL DATA: Chest pain and tachycardia

EXAM:
CHEST - 2 VIEW

[chest pa]
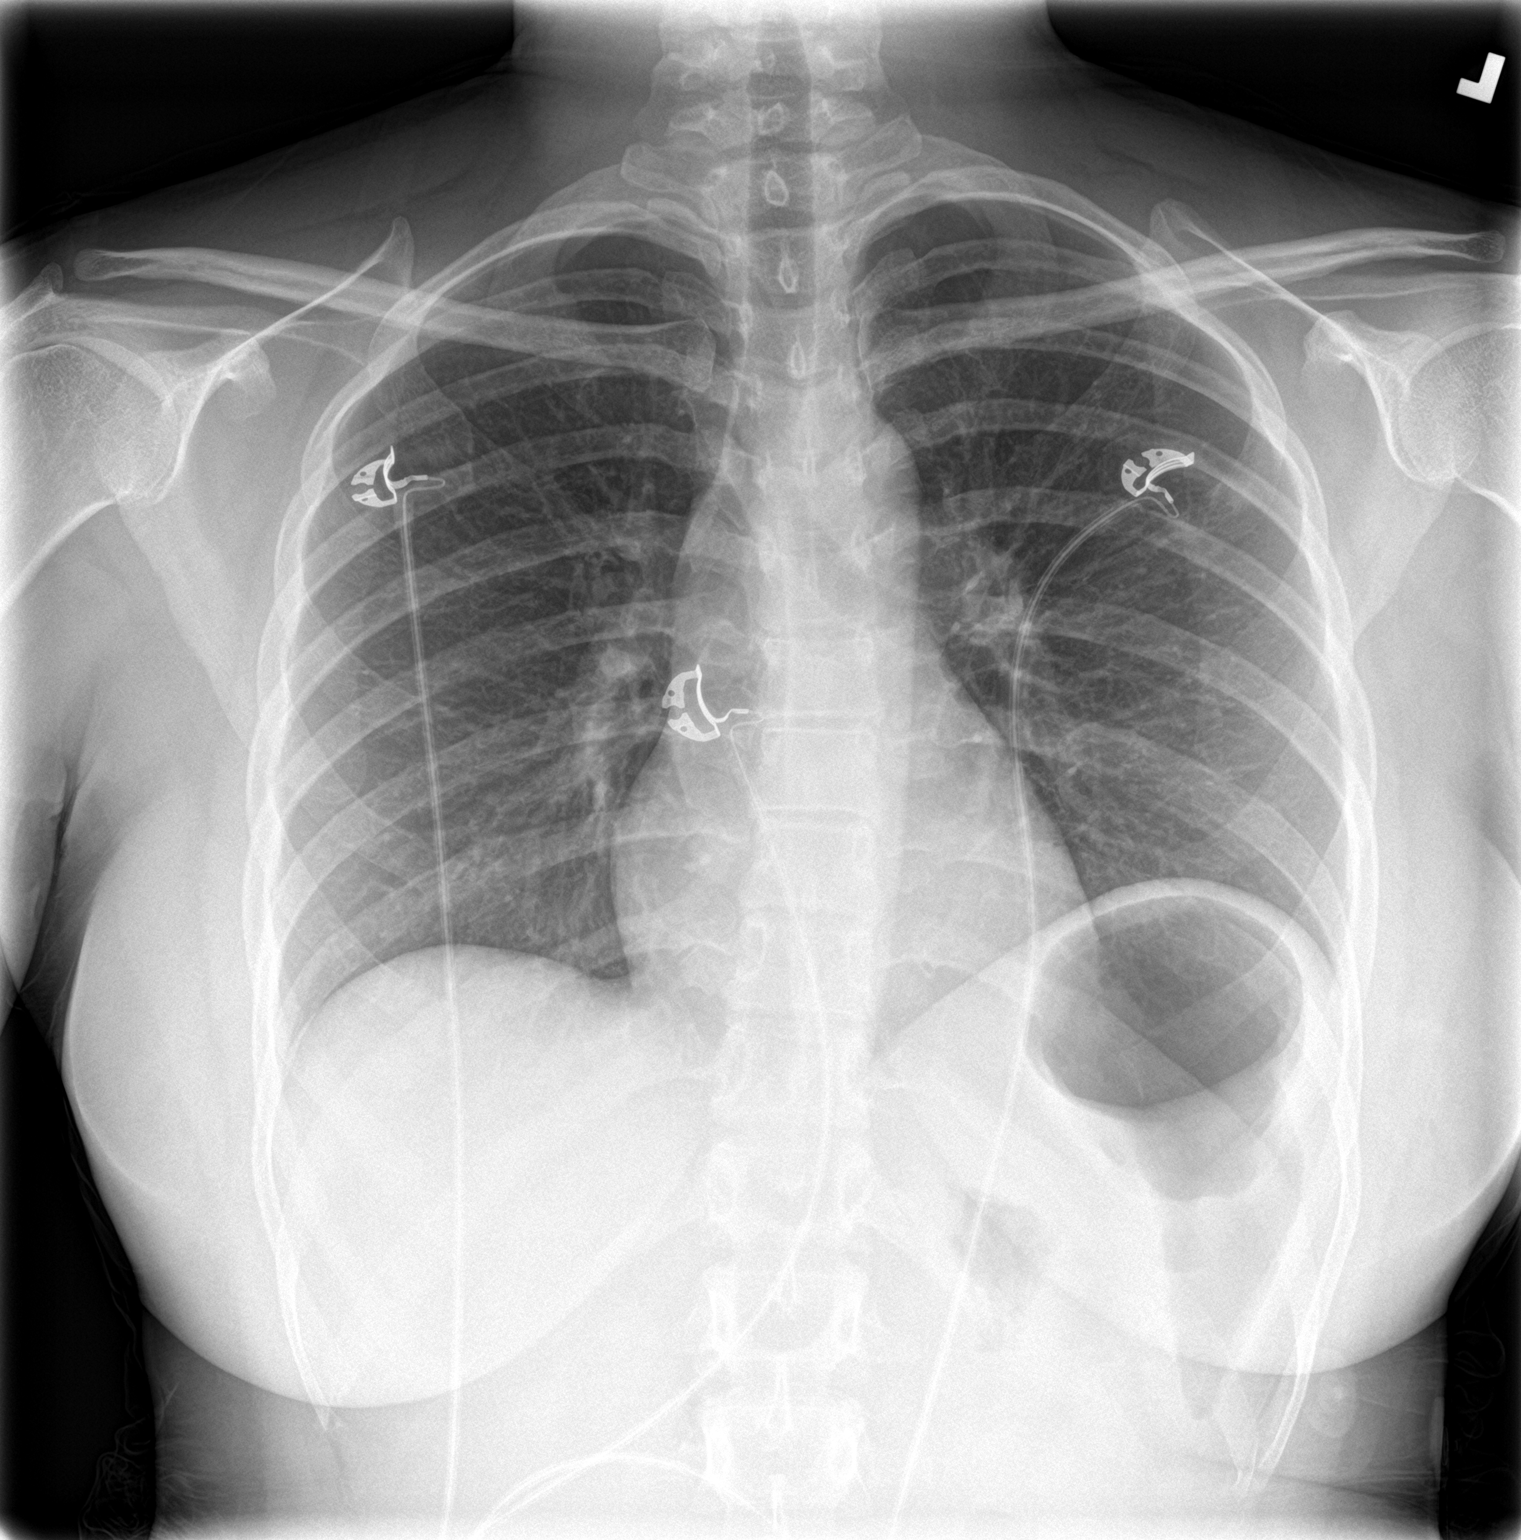

[chest lat]
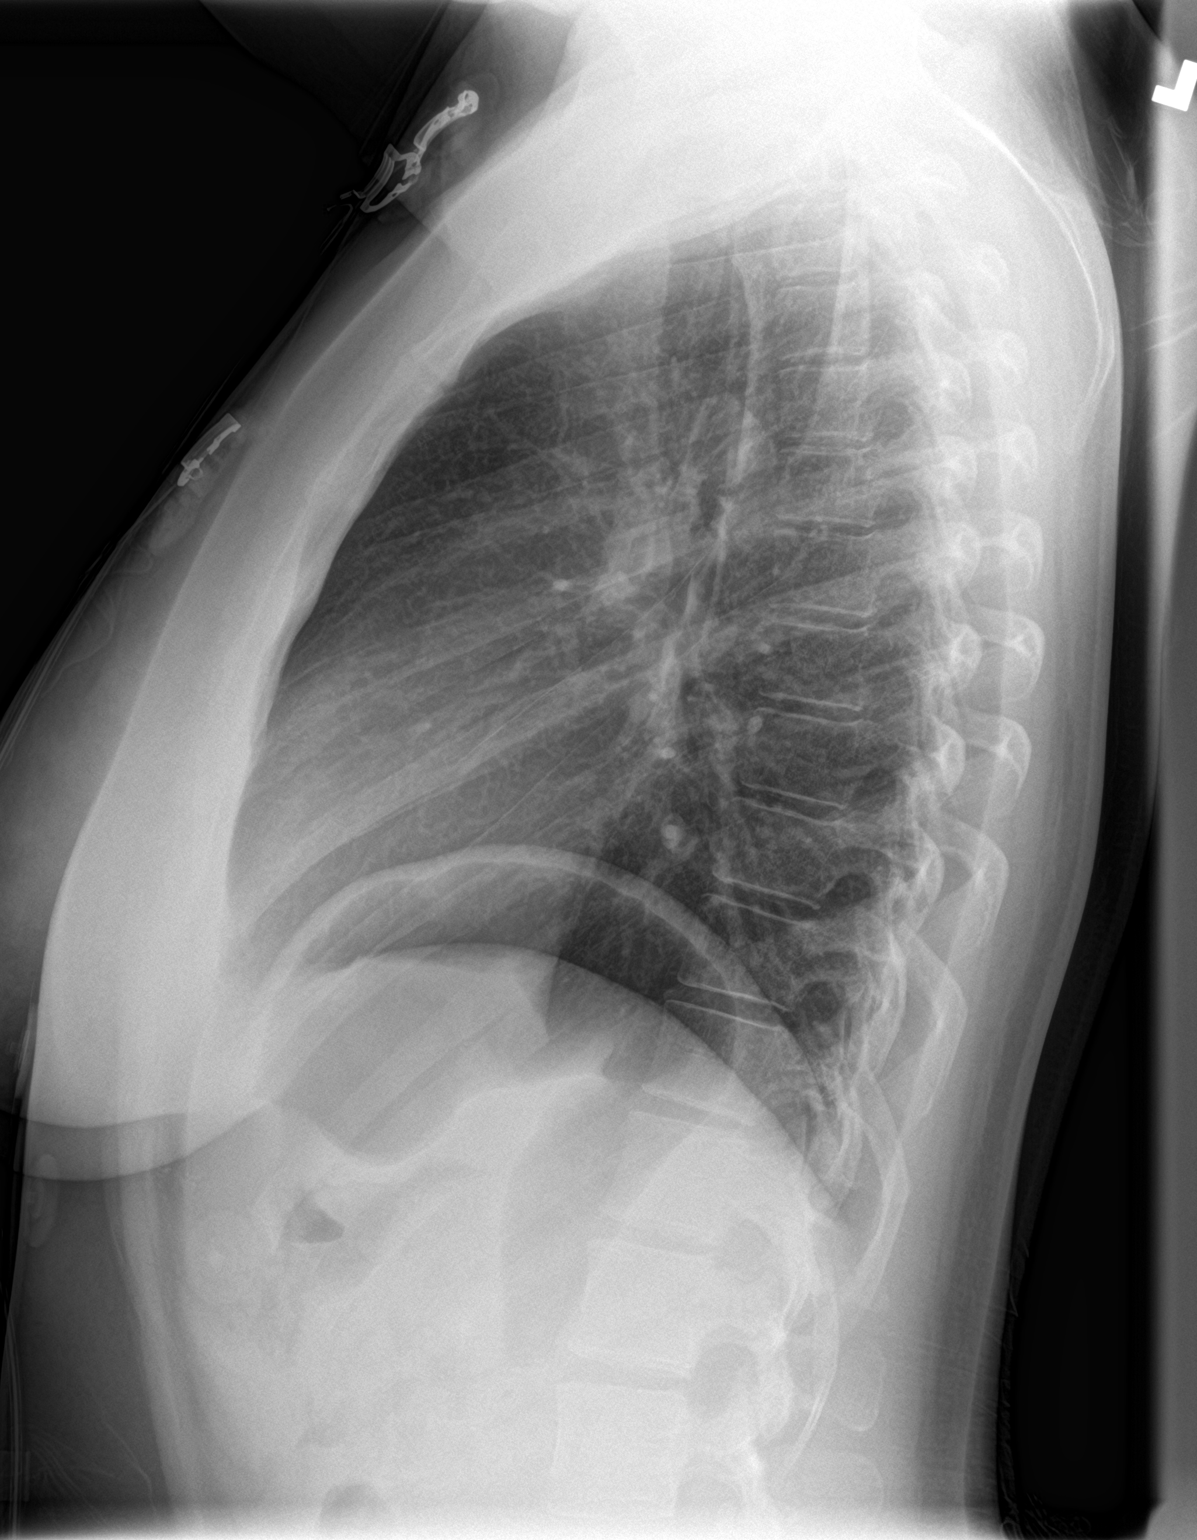

[2 of 2 positions shown; findings below may reference images not displayed]

FINDINGS: The heart size and mediastinal contours are within normal limits.
Both lungs are clear. The visualized skeletal structures are
unremarkable.
IMPRESSION: No active cardiopulmonary disease.

## 2022-03-03 ENCOUNTER — Ambulatory Visit (INDEPENDENT_AMBULATORY_CARE_PROVIDER_SITE_OTHER): Payer: Medicaid Other

## 2022-03-03 VITALS — BP 95/64 | HR 67 | Temp 98.3°F | Resp 16 | Ht 59.0 in | Wt 128.4 lb

## 2022-03-03 DIAGNOSIS — K5100 Ulcerative (chronic) pancolitis without complications: Secondary | ICD-10-CM

## 2022-03-03 MED ORDER — DIPHENHYDRAMINE HCL 25 MG PO CAPS
25.0000 mg | ORAL_CAPSULE | Freq: Once | ORAL | Status: AC
  Administered 2022-03-03: 25 mg via ORAL
  Filled 2022-03-03: qty 1

## 2022-03-03 MED ORDER — METHYLPREDNISOLONE SODIUM SUCC 40 MG IJ SOLR
40.0000 mg | Freq: Once | INTRAMUSCULAR | Status: AC
  Administered 2022-03-03: 40 mg via INTRAVENOUS
  Filled 2022-03-03: qty 1

## 2022-03-03 MED ORDER — SODIUM CHLORIDE 0.9 % IV SOLN
5.0000 mg/kg | Freq: Once | INTRAVENOUS | Status: AC
  Administered 2022-03-03: 300 mg via INTRAVENOUS
  Filled 2022-03-03: qty 30

## 2022-03-03 MED ORDER — ACETAMINOPHEN 325 MG PO TABS
650.0000 mg | ORAL_TABLET | Freq: Once | ORAL | Status: AC
  Administered 2022-03-03: 650 mg via ORAL
  Filled 2022-03-03: qty 2

## 2022-03-03 NOTE — Progress Notes (Signed)
Diagnosis: Ulcerative Pancolitis  Provider:  Marshell Garfinkel MD  Procedure: Infusion  IV Type: Peripheral, IV Location: L Antecubital  Remicade (Infliximab), Dose: 300 mg  Infusion Start Time: 1024  Infusion Stop Time: 6333  Post Infusion IV Care: Peripheral IV Discontinued  Discharge: Condition: Good, Destination: Home . AVS provided to patient.   Performed by:  Koren Shiver, RN

## 2022-03-04 ENCOUNTER — Encounter: Payer: Self-pay | Admitting: Gastroenterology

## 2022-03-07 ENCOUNTER — Other Ambulatory Visit: Payer: Self-pay

## 2022-03-07 DIAGNOSIS — K51011 Ulcerative (chronic) pancolitis with rectal bleeding: Secondary | ICD-10-CM

## 2022-03-07 MED ORDER — PREDNISONE 10 MG PO TABS: ORAL_TABLET | ORAL | 0 refills | Status: AC

## 2022-03-07 NOTE — Telephone Encounter (Signed)
Spoke with pt and gave pt recommendations. Pt verbalized understanding. Prednisone sent to pt's pharmacy. Pt knows to come in for labs before follow up appointment. Pt states she will upload new insurance card to [employer].

## 2022-03-11 ENCOUNTER — Telehealth: Payer: Self-pay | Admitting: Gastroenterology

## 2022-03-11 ENCOUNTER — Other Ambulatory Visit (INDEPENDENT_AMBULATORY_CARE_PROVIDER_SITE_OTHER): Payer: Medicaid Other

## 2022-03-11 ENCOUNTER — Encounter: Payer: Self-pay | Admitting: Nurse Practitioner

## 2022-03-11 ENCOUNTER — Ambulatory Visit (INDEPENDENT_AMBULATORY_CARE_PROVIDER_SITE_OTHER): Payer: Medicaid Other | Admitting: Nurse Practitioner

## 2022-03-11 VITALS — BP 100/64 | HR 62 | Ht 59.0 in | Wt 129.0 lb

## 2022-03-11 DIAGNOSIS — K51011 Ulcerative (chronic) pancolitis with rectal bleeding: Secondary | ICD-10-CM | POA: Diagnosis not present

## 2022-03-11 DIAGNOSIS — K5100 Ulcerative (chronic) pancolitis without complications: Secondary | ICD-10-CM | POA: Diagnosis not present

## 2022-03-11 LAB — SEDIMENTATION RATE: Sed Rate: 22 mm/hr — ABNORMAL HIGH (ref 0–20)

## 2022-03-11 LAB — C-REACTIVE PROTEIN: CRP: <1 mg/dL (ref 0.5–20.0)

## 2022-03-11 NOTE — Progress Notes (Signed)
Agree with the assessment and plan as outlined by Carl Best, NP.   Agree that this seems most consistent with primary nonresponder of Remicade.  Agree with checking infliximab trough/antibody level, but would go ahead and check that 1 week prior to the next infusion.  That would allow [place] time to receive the results and make a medication adjustment if necessary.  Continue steroid with wean as outlined.  Would start insurance approval process for Entyvio or Rinvoq, as this has high suspicion for Remicade nonresponder.   Gerrit Heck, DO, [facility]

## 2022-03-11 NOTE — Telephone Encounter (Signed)
Returned call to patient. Left patient a detailed vm letting her know that she can have labs completed this morning prior to her appt this afternoon. I told pt that I can't guarantee that we will have the results back prior to her appt, but she can go ahead and have them drawn. I also informed patient that Dr. Bryan Lemma ordered some stool test that she should completed as well. The lab will provide her with the stool kit and collection instructions. I advised pt to call back if she had any questions or concerns.

## 2022-03-11 NOTE — Patient Instructions (Addendum)
Please come back to get your labs done 1-2 days before your next Remicade infusion.  Continue Prednisone taper as prescribed.  Contact our office if symptoms worsen.  Thank you for trusting me with your gastrointestinal care!   Carl Best, CRNP

## 2022-03-11 NOTE — Progress Notes (Signed)
03/11/2022 [name] [id number] [birth year]   Chief Complaint: Ulcerative colitis flare, bloody diarrhea   History of Present Illness: [name] is a 37 year old female with a history of pan ulcerative colitis initially diagnosed 12/2019.  She is on Remicade 5 mg/kg infusions every 8 weeks.  She was last seen in office by Dr. Bryan Lemma 10/22/2021. At that time, she was having urgent frequent loose stools during the day and nighttime.  She was placed back on prednisone with instructions to have infliximab drug and antibody level done prior to her next Remicade infusion which was not done.  A GI pathogen panel and a fecal calprotectin level were ordered but were not done.  Labs showed a WBC count of 10.4.  Hemoglobin 11.9.  Hematocrit 35.6.  Sed rate 32.  CRP <1.  She presents today with complaints of frequent loose bloody and urgent bowel movements which did not improved after she received her Remicade infusion 03/03/2022.  She reported passing clots of blood with gas per the rectum as well as blood mixed in the stool.  She was restarted on a Prednisone taper on Monday 03/07/2022 per Dr. Bryan Lemma and her symptoms have improved, medic easier has decreased.  She denies having any abdominal pain or fever.  No NSAID use.  CRP and sed rate levels were drawn prior to her appointment, results pending.  Endoscopic History: - Colonoscopy (12/2019, Dr. 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  Normal TI  Current Outpatient Medications on File Prior to Visit  Medication Sig Dispense Refill   ondansetron (ZOFRAN ODT) 4 MG disintegrating tablet Take 1 tablet (4 mg total) by mouth every 8 (eight) hours as needed for nausea or vomiting. 30 tablet 0   predniSONE (DELTASONE) 10 MG tablet Take 4 tablets (40 mg total) by mouth daily with breakfast for 14 days, THEN 3 tablets (30 mg total) daily with breakfast for 7 days, THEN 2 tablets (20 mg total) daily with breakfast for 7 days,  THEN 1.5 tablets (15 mg total) daily with breakfast for 7 days, THEN 1 tablet (10 mg total) daily with breakfast for 7 days, THEN 0.5 tablets (5 mg total) daily with breakfast for 7 days. 112 tablet 0   loperamide (IMODIUM A-D) 2 MG tablet Take 1 tablet (2 mg total) by mouth 4 (four) times daily as needed for diarrhea or loose stools. (Patient not taking: Reported on 03/11/2022) 30 tablet 0   metoCLOPramide (REGLAN) 10 MG tablet Take 10 mg by mouth every 6 (six) hours as needed for nausea. (Patient not taking: Reported on 03/11/2022)     Probiotic Product (PROBIOTIC BLEND) CAPS Take 1-2 capsules by mouth daily. (Patient not taking: Reported on 03/11/2022) 60 capsule 1   [DISCONTINUED] insulin glargine (LANTUS) 100 UNIT/ML injection Inject 0.1 mLs (10 Units total) into the skin at bedtime. 10 mL 0   No current facility-administered medications on file prior to visit.   No Known Allergies  Current Medications, Allergies, Past Medical History, Past Surgical History, Family History and Social History were reviewed in [employer] record.  Review of Systems:   Constitutional: Negative for fever, sweats, chills or weight loss.  Respiratory: Negative for shortness of breath.   Cardiovascular: Negative for chest pain, palpitations and leg swelling.  Gastrointestinal: See HPI.  Musculoskeletal: Negative for back pain or muscle aches.  Neurological: Negative for dizziness, headaches or paresthesias.    Physical Exam: BP 100/64   Pulse 62  Ht 4' 11"$  (1.499 m)   Wt 129 lb (58.5 kg)   SpO2 96%   BMI 26.05 kg/m  General: 37 year old female in no acute distress. Head: Normocephalic and atraumatic. Eyes: No scleral icterus. Conjunctiva pink . Ears: Normal auditory acuity. Mouth: Dentition intact. No ulcers or lesions.  Lungs: Clear throughout to auscultation. Heart: Regular rate and rhythm, no murmur. Abdomen: Soft, nontender and nondistended. No masses or hepatomegaly. Normal  bowel sounds x 4 quadrants.  Rectal: Deferred.  Musculoskeletal: Symmetrical with no gross deformities. Extremities: No edema. Neurological: Alert oriented x 4. No focal deficits.  Psychological: Alert and cooperative. Normal mood and affect  Assessment and Recommendations:  45) 37 year old female with pan-ulcerative colitis on Remicade infusions Q 8 weeks with recurrent urgent bloody loose stools without abdominal pain, concerning for Remicade failure.  Remicade infusion was 03/03/2022.  Colitis symptoms have stabilized on prednisone.  -GI pathogen panel/C. Dif PCR and fecal calprotectin level, patient to complete ASAP -Continue Prednisone taper as previously prescribed: 40 mg daily x 2 weeks, then 30 mg x 1 week, 20 mg x 1 week, 15 mg x 1 week, 10 mg x 1 week, 5 mg x 1 week -Check infliximab antibody and drug level 1 to 2 days prior to next Remicade (Infliximab)infusion -Will need to change biologic therapy if she has antibodies to infliximab, consider Entyvio vs Rinvoq.  -Patient to contact office if symptoms worsen -Further follow-up recommendations to be determined after the above lab results reviewed  2) IDA, previously received IV iron.  Hemoglobin 12.2 and normal iron panel 02/11/2022. -Repeat CBC if she has persistent or worsening hematochezia -Continue follow-up with hematology

## 2022-03-11 NOTE — Telephone Encounter (Signed)
Patient has appointment today at 1:30 with [facility], patient is wondering if she can come in to get her lab work before her appointment as she was not able to get it done sooner. Please advise.

## 2022-03-13 NOTE — Progress Notes (Signed)
Remo Lipps, pls contact the patient and let her know Dr. Bryan Lemma prefers for her to have the Infliximab drug/antibody level done one week  before her infusion and not 1 or 2 days as I instructed as he wants to make sure he gets the results before she proceeds with her next infusion.  Per Dr. Bryan Lemma pls initiate authorization for Entyvio or Rinvoq, see his addendum. THX

## 2022-03-14 ENCOUNTER — Telehealth: Payer: Self-pay

## 2022-03-14 ENCOUNTER — Other Ambulatory Visit: Payer: Self-pay

## 2022-03-14 DIAGNOSIS — K5100 Ulcerative (chronic) pancolitis without complications: Secondary | ICD-10-CM

## 2022-03-14 NOTE — Telephone Encounter (Signed)
Pt made aware of Dr. Bryan Lemma recommendations to have lab drawn 1 week prior in next infusion. Request to  start insurance approval process for Entyvio or Rinvoq, as this has high suspicion for Remicade nonresponder forwarded to PA dept. Pt verbalized understanding with all questions answered.

## 2022-03-14 NOTE — Telephone Encounter (Signed)
Please see note below and please start  insurance approval process for Entyvio or Rinvoq as this pt as this has high suspicion for Remicade nonresponder.

## 2022-03-14 NOTE — Telephone Encounter (Signed)
Gerrit Heck V, DO at 03/11/2022  1:30 PM  Status: Signed  Agree with the assessment and plan as outlined by Carl Best, NP.    Agree that this seems most consistent with primary nonresponder of Remicade.  Agree with checking infliximab trough/antibody level, but would go ahead and check that 1 week prior to the next infusion.  That would allow [place] time to receive the results and make a medication adjustment if necessary.  Continue steroid with wean as outlined.  Would start insurance approval process for Entyvio or Rinvoq, as this has high suspicion for Remicade nonresponder.     Gerrit Heck, DO, [facility]

## 2022-04-08 ENCOUNTER — Encounter: Payer: Self-pay | Admitting: Oncology

## 2022-04-08 ENCOUNTER — Other Ambulatory Visit (HOSPITAL_COMMUNITY): Payer: Self-pay

## 2022-04-08 ENCOUNTER — Encounter: Payer: Self-pay | Admitting: Gastroenterology

## 2022-04-08 NOTE — Telephone Encounter (Signed)
Please see notes below and advise 

## 2022-04-08 NOTE — Telephone Encounter (Signed)
Pt is still showing two different insurance on file. [employer] and [employer]. I did test biling for both Entyvio and Rinvoq and they both require a PA. If patient insurance is [employer], Rinvoq was previously denied because pt did not have a trial of Humira. I will call patient to verify the correct insurance to submit the PA's on. However, what would the dose regiment be for Rinvoq, and what dose would the Entyvio be? If patient has to do infusion through medical that would go through [employer].

## 2022-04-11 NOTE — Telephone Encounter (Signed)
Please see notes below.

## 2022-04-15 ENCOUNTER — Telehealth: Payer: Self-pay | Admitting: Pharmacy Technician

## 2022-04-15 ENCOUNTER — Other Ambulatory Visit (HOSPITAL_COMMUNITY): Payer: Self-pay

## 2022-04-15 NOTE — Telephone Encounter (Signed)
Patient Advocate Encounter  Received notification from [facility] that prior authorization for [facility] 45MG  is required.   PA submitted on 3.15.24 Key [id number] Status is pending   Patient Advocate Encounter  Received notification from [facility] that prior authorization for [facility] 45MG  is required.   PA submitted on 3.15.24 Key [id number] Status is pending

## 2022-04-15 NOTE — Telephone Encounter (Signed)
PA has been submitted for Rinvoq, and telephone encounter has been created. Weyman Rodney would need to be sent to [facility] for infusion.

## 2022-04-15 NOTE — Telephone Encounter (Signed)
Remo Lipps,  As of now, I do not have a referral for Entyvio.  Please enter the referral and I will begin the BIV and Auth process.

## 2022-04-15 NOTE — Telephone Encounter (Signed)
Please see notes below.

## 2022-04-18 ENCOUNTER — Other Ambulatory Visit: Payer: Self-pay

## 2022-04-18 NOTE — Telephone Encounter (Signed)
Yes, I have received the referral and will begin the BIV and Auth process.  I will f/u once once patient is approved

## 2022-04-18 NOTE — Telephone Encounter (Signed)
Hi Kim.  I entered the Referral/ Treatment plan. Not sure if this was what you requested for the referral but unsure of any other way. Please review and advise

## 2022-04-18 NOTE — Telephone Encounter (Signed)
PA request has been APPROVED from 04/15/2022-04/15/2023

## 2022-04-27 ENCOUNTER — Telehealth: Payer: Self-pay | Admitting: Pharmacy Technician

## 2022-04-27 ENCOUNTER — Other Ambulatory Visit: Payer: Medicaid Other

## 2022-04-27 DIAGNOSIS — K5100 Ulcerative (chronic) pancolitis without complications: Secondary | ICD-10-CM

## 2022-04-27 NOTE — Telephone Encounter (Signed)
[name],  Needing clarification: Per chart notes, you would like for patient to receive Entyvio or Rinvoq.  Per chart notes, Rinvoq has been approved Would you like to move forward with Rinvoq for treatment??  Also, patient is scheduled for Remicade treatment tomorrow. Would you like for patient to still receive dose?  If you would like Entyvio, unfortunately due to contract pricing [facility] will not be able to treat patient.  She will need to be referred to another site [facility])

## 2022-04-28 ENCOUNTER — Other Ambulatory Visit: Payer: Self-pay

## 2022-04-28 ENCOUNTER — Ambulatory Visit: Payer: Medicaid Other

## 2022-04-28 DIAGNOSIS — K5100 Ulcerative (chronic) pancolitis without complications: Secondary | ICD-10-CM

## 2022-04-28 MED ORDER — PREDNISONE 10 MG PO TABS: ORAL_TABLET | ORAL | 0 refills | Status: AC

## 2022-04-28 NOTE — Telephone Encounter (Signed)
Pt made aware of recent recommendations from Carl Best NP. Prescription sent to pharmacy.  Pt made aware. Pt verbalized understanding with all questions answered.

## 2022-05-05 ENCOUNTER — Ambulatory Visit: Payer: Medicaid Other

## 2022-05-05 ENCOUNTER — Ambulatory Visit (INDEPENDENT_AMBULATORY_CARE_PROVIDER_SITE_OTHER): Payer: Medicaid Other

## 2022-05-05 ENCOUNTER — Telehealth: Payer: Self-pay | Admitting: Nurse Practitioner

## 2022-05-05 VITALS — BP 87/57 | HR 75 | Temp 98.4°F | Resp 14 | Ht 59.0 in | Wt 124.6 lb

## 2022-05-05 DIAGNOSIS — K5100 Ulcerative (chronic) pancolitis without complications: Secondary | ICD-10-CM | POA: Diagnosis not present

## 2022-05-05 LAB — SERIAL MONITORING

## 2022-05-05 LAB — INFLIXIMAB+AB (SERIAL MONITOR)
Anti-Infliximab Antibody: 74 ng/mL
Infliximab Drug Level: 0.8 ug/mL

## 2022-05-05 MED ORDER — METHYLPREDNISOLONE SODIUM SUCC 40 MG IJ SOLR
40.0000 mg | Freq: Once | INTRAMUSCULAR | Status: AC
  Administered 2022-05-05: 40 mg via INTRAVENOUS
  Filled 2022-05-05: qty 1

## 2022-05-05 MED ORDER — SODIUM CHLORIDE 0.9 % IV SOLN
5.0000 mg/kg | Freq: Once | INTRAVENOUS | Status: AC
  Administered 2022-05-05: 300 mg via INTRAVENOUS
  Filled 2022-05-05: qty 30

## 2022-05-05 MED ORDER — DIPHENHYDRAMINE HCL 25 MG PO CAPS
25.0000 mg | ORAL_CAPSULE | Freq: Once | ORAL | Status: AC
  Administered 2022-05-05: 25 mg via ORAL
  Filled 2022-05-05: qty 1

## 2022-05-05 MED ORDER — ACETAMINOPHEN 325 MG PO TABS
650.0000 mg | ORAL_TABLET | Freq: Once | ORAL | Status: AC
  Administered 2022-05-05: 650 mg via ORAL
  Filled 2022-05-05: qty 2

## 2022-05-05 NOTE — Telephone Encounter (Signed)
PT is calling to get results from labs on 3/27. She also wants to know if she should still have infusion done. Please advise.

## 2022-05-05 NOTE — Telephone Encounter (Signed)
[name] and Dr. Bryan Lemma,  I just received the Infliximab antibody level wich was positive, considered low antibody level of 74. 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 Stop Remicade infusions and proceed with Rinvok. However, [name] please wait for Dr. Vivia Ewing response before sending in RX for Rinvok

## 2022-05-05 NOTE — Telephone Encounter (Signed)
It looks like patient already received Remicade infusion that was scheduled this morning. Will await further recommendations from Dr. Bryan Lemma upon his return.

## 2022-05-05 NOTE — Telephone Encounter (Signed)
[place], her Infliximab drug level is low and the Infliximab antibody level is pending. If she has antibodies to Infliximab (we will stop Infliximab/Remicade). If she does not have antibodies to Infliximab, the dose can be increased vs switching to Rinvok which is a decision her primary GI Dr Bryan Lemma should make. I sent Dr. Bryan Lemma a msg attached to that lab result, however, but he was out of the office until 4/4.  Dr. Bryan Lemma planned on starting Rinvok (authorization received) pending the results of Infliximab drug/antibody lab results.   Hold off on Infliximab infusion until antibody result received and reviewed by Dr. Bryan Lemma.   Refer to lengthy phone note 04/27/2022.  Thank you

## 2022-05-05 NOTE — Progress Notes (Signed)
Diagnosis: Crohn's Disease  Provider:  Marshell Garfinkel MD  Procedure: Infusion  IV Type: Peripheral, IV Location: L Antecubital  Remicade (Infliximab), Dose: 300 mg  Infusion Start Time: [id number]  Infusion Stop Time: [id number]  Post Infusion IV Care: Peripheral IV Discontinued  Discharge: Condition: Good, Destination: Home . AVS Declined  Performed by:  Adelina Mings, LPN

## 2022-05-06 ENCOUNTER — Telehealth: Payer: Self-pay

## 2022-05-06 NOTE — Telephone Encounter (Signed)
This has been addressed. See alternate telephone encounter (05/05/22).

## 2022-05-06 NOTE — Telephone Encounter (Signed)
Monchell, will you please initiate PA for Rinvoq 45 mg for 8 weeks, then Rinvoq 30 mg as the maintenance dose. Thank you

## 2022-05-06 NOTE — Telephone Encounter (Signed)
Called and spoke with patient regarding Infliximab results and recommendations. [name] understands that we will have the PA team work on getting authorization for Rinvoq 45 mg and 30 mg. [name] received Remicade infusion yesterday, she understands that this should help her get through until we hear back from her insurance. [name] has a f/u with Dr. Barron Alvine on 05/13/22 at 1:20 pm. [name] verbalized understanding and had no concerns at the end of the call.   See alternated telephone encounter for Rinvoq PA details.

## 2022-05-11 ENCOUNTER — Other Ambulatory Visit (HOSPITAL_COMMUNITY): Payer: Self-pay

## 2022-05-11 MED ORDER — RINVOQ 30 MG PO TB24: 1.0000 | ORAL_TABLET | Freq: Every day | ORAL | 11 refills | Status: DC

## 2022-05-11 MED ORDER — RINVOQ 45 MG PO TB24: 1.0000 | ORAL_TABLET | Freq: Every day | ORAL | 0 refills | Status: DC

## 2022-05-11 NOTE — Addendum Note (Signed)
Addended by: Missy Sabins on: 05/11/2022 04:18 PM   Modules accepted: Orders

## 2022-05-11 NOTE — Telephone Encounter (Signed)
Called and informed patient that Rinvoq has been approved. Pt has been advised that she take Rinvoq 45 mg for 8 weeks and then decrease to Rinvoq 30 mg daily for her maintenance dose. Pt is aware that RX has been sent to [facility] pharmacy on file. Pt is aware that she can start medication as soon as she picks it up and we will see her on Friday for her f/u appt. Pt verbalized understanding and had no concerns at the end of the call.

## 2022-05-11 NOTE — Telephone Encounter (Signed)
Dr. Barron Alvine, PA for Rinvoq has been APPROVED from 04/15/2022-04/15/2023. When do you want patient to begin? Her last Remicade infusion was on 05/05/22. She has a f/u appt with you on 05/13/22. Thanks

## 2022-05-13 ENCOUNTER — Ambulatory Visit (INDEPENDENT_AMBULATORY_CARE_PROVIDER_SITE_OTHER): Payer: Medicaid Other | Admitting: Gastroenterology

## 2022-05-13 ENCOUNTER — Encounter: Payer: Self-pay | Admitting: Gastroenterology

## 2022-05-13 VITALS — BP 118/78 | HR 74 | Ht 59.0 in | Wt 124.0 lb

## 2022-05-13 DIAGNOSIS — K5100 Ulcerative (chronic) pancolitis without complications: Secondary | ICD-10-CM | POA: Diagnosis not present

## 2022-05-13 DIAGNOSIS — Z7189 Other specified counseling: Secondary | ICD-10-CM | POA: Diagnosis not present

## 2022-05-13 DIAGNOSIS — D500 Iron deficiency anemia secondary to blood loss (chronic): Secondary | ICD-10-CM

## 2022-05-13 NOTE — Patient Instructions (Addendum)
Your provider has requested that you go to the basement level for lab work in 3 Weeks. Press "B" on the elevator. The lab is located at the first door on the left as you exit the elevator.   You have been scheduled for an appointment with Dr. Barron Alvine on 08/23/22 at 8:20 AM . Please arrive 10 minutes early for your appointment.   You will be contacted by [facility] to schedule a consult.   _______________________________________________________  If your blood pressure at your visit was 140/90 or greater, please contact your primary care physician to follow up on this.  _______________________________________________________  If you are age 5 or younger, your body mass index should be between 19-25. Your Body mass index is 25.04 kg/m. If this is out of the aformentioned range listed, please consider follow up with your Primary Care Provider.   __________________________________________________________  The [facility] providers would like to encourage you to use [facility] to communicate with providers for non-urgent requests or questions.  Due to long hold times on the telephone, sending your provider a message by [facility] may be a faster and more efficient way to get a response.  Please allow 48 business hours for a response.  Please remember that this is for non-urgent requests.   Due to recent changes in healthcare laws, you may see the results of your imaging and laboratory studies on MyChart before your provider has had a chance to review them.  We understand that in some cases there may be results that are confusing or concerning to you. Not all laboratory results come back in the same time frame and the provider may be waiting for multiple results in order to interpret others.  Please give [place] 48 hours in order for your provider to thoroughly review all the results before contacting the office for clarification of your results.    Thank you for choosing me and [facility].  Vito Cirigliano, D.O.

## 2022-05-13 NOTE — Progress Notes (Signed)
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  Was treated with prednisone and she reports having clinical improvement during the 4 weeks that she took this medication.   - 12/2019:  normal/negative QuantiFERON gold.  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 HBsAg-. - She never followed up with Dr. Levora Angel at [facility], and transitioned to "herbal remedies". - 12/2020: Hospital admission with UC flare.  Albumin 1.4, H/H 10/30, PLT 615.  Treated with prednisone with plan to transition to biologic therapy as outpatient (did not want to start immunomodulator due to intermediate TPMT level).  CRP 4.8, ESR 64, GI PCR panel negative - 01/2021: Follow-up with [facility].  Had planned to initiate Remicade, but her insurance changed and can no longer follow with [facility] - 03/04/2021: CT A/P: Marked diffuse colorectal wall thickening and mucosal hyperenhancement with loss of haustral folds and slight haziness of pericolonic fat worse compared with 12/2019 c/w UC pancolitis - 07/14/2021: Established outpatient care with [facility].  Was having breakthrough symptoms at prednisone 20 mg/day with up to 10 BM/day, rectal bleeding, diarrhea.  QuantiFERON gold negative.  ESR 82, CRP 2.4, H/H 7.5/24 with MCV/RDW 56/19, ferritin 12.7, iron 13, TIBC 344, sat 3.8%.  B12 364, folate 7.8.  Was referred for IV iron, initiating approval for Remicade - 08/04/2021: Evaluation in the Hematology clinic.  Started IV Venofer - 09/08/2021: GI follow-up.  Reported clinical improvement since initiation of Remicade with 1 BM/day, occasional BRBPR but improved.  Was still on prednisone 20 mg/day; started weaning. - 10/22/2021: GI follow-up.  Reports having flare of symptoms starting in mid August despite Remicade infusions with increased urgency, nocturnal stools.  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  Usually symptoms improved after starting prednisone, but recurrence of symptoms when prednisone tapers below 20 mg/day. Labs confirmed presence of anti-infliximab antibody (74) and low drug l trough evel (0.8)    Endoscopic History: - Colonoscopy (12/2019, Dr. Levora Angel): Severe Mayo 3 inflammation from rectum to cecum.  Normal TI  HPI:     Patient is a 37 y.o. female presenting to the Gastroenterology Clinic for follow-up.  Last seen in the office by Alcide Evener on 03/11/2022.  At that time was doing better from a UC standpoint after starting prednisone.  Was overall deemed to be primary nonresponder to infliximab, and therapy stopped and exchanged for Rinvoq.  Rinvoq has since been approved and picked up by patient, with plan to start later today.  Still urgency, but improved form and decreased frequency. No hematochezia.    No new labs or imaging since last appointment.  Review of systems:     No chest pain, no SOB, no fevers, no urinary sx   Past Medical History:  Diagnosis Date   Anemia    Hypokalemia 12/09/2019   Hyponatremia 12/11/2019   Pre-diabetes    Ulcerative colitis     Patient's surgical history, family medical history, social history, medications and allergies were all reviewed in Epic    Current Outpatient Medications  Medication Sig Dispense Refill   ondansetron (ZOFRAN ODT) 4 MG disintegrating tablet Take 1 tablet (4 mg total) by mouth every 8 (eight) hours as needed for nausea or vomiting. 30 tablet 0   Probiotic Product (PROBIOTIC BLEND) CAPS Take 1-2  capsules by mouth daily. 60 capsule 1   Upadacitinib ER (RINVOQ) 30 MG TB24 Take 1 tablet (30 mg total) by mouth daily. MAINTENANCE DOSE 30 tablet 11   Upadacitinib ER (RINVOQ) 45 MG TB24 Take 1 tablet (45 mg total) by mouth daily. FOR 8 WEEKS - STARTER DOSE 56 tablet 0    loperamide (IMODIUM A-D) 2 MG tablet Take 1 tablet (2 mg total) by mouth 4 (four) times daily as needed for diarrhea or loose stools. (Patient not taking: Reported on 05/13/2022) 30 tablet 0   metoCLOPramide (REGLAN) 10 MG tablet Take 10 mg by mouth every 6 (six) hours as needed for nausea. (Patient not taking: Reported on 05/13/2022)     No current facility-administered medications for this visit.    Physical Exam:     BP 118/78   Pulse 74   Ht 4\' 11"  (1.499 m)   Wt 124 lb (56.2 kg)   BMI 25.04 kg/m   GENERAL:  Pleasant female in NAD PSYCH: : Cooperative, normal affect NEURO: Alert and oriented x 3, no focal neurologic deficits   IMPRESSION and PLAN:    1) Ulcerative Colitis (Pancolitis): 37 year old female with a history of steroid responsive pan ulcerative colitis diagnosed 12/2019, eventually started on infliximab in 08/2021.  Never fully responded to infliximab (primary nonresponder), and labs confirmed presence of anti-infliximab antibody (74) and low drug level (0.8).   We discussed initiation of Rinvoq in detail today.  We discussed the potential side effect profile to include, but not limited to, risk of serious infection, malignancy (lymphoma, lung cancer in current/past smokers), cardiovascular event (age >50 and 1 CV risk factor),thrombosis, neutropenia, lymphopenia, anemia, liver enzyme elevation, lipid elevation, acne, and patient would like to proceed.  Plan for the following:  - Start Rinvoq 45 mg daily x 8 weeks, then reduce to 30 mg daily - Recommend Shingrix vaccine - Dermatology referral for annual exam - Check CBC, LFTs 3-4 weeks after initiation - Check lipid panel 12 weeks after initiation of therapy - Avoid live vaccines   2) Iron deficiency anemia - Good response to IV iron with normalization of hemoglobin and iron panel in 01/2022.  Ferritin was still low normal at 11. - Repeat CBC and iron panel in 2-3 months  3) Medication counseling Extended  counseling regarding IBD therapy as outlined above.  I discussed risks/benefits, alternatives of treatment options, with plan to initiate new biologic therapy as above  RTC in 3 months or sooner as needed  I spent over 30 minutes of time, including in depth chart review, independent review of results as outlined above, communicating results with the patient directly, face-to-face time with the patient, coordinating care, and ordering studies and medications as appropriate, and documentation.           Shellia Cleverly ,DO, FACG 05/13/2022, 1:49 PM

## 2022-06-15 ENCOUNTER — Encounter: Payer: Self-pay | Admitting: Gastroenterology

## 2022-06-23 ENCOUNTER — Encounter: Payer: Self-pay | Admitting: Gastroenterology

## 2022-06-23 ENCOUNTER — Encounter: Payer: Self-pay | Admitting: Oncology

## 2022-06-30 ENCOUNTER — Ambulatory Visit: Payer: BLUE CROSS/BLUE SHIELD

## 2022-07-05 ENCOUNTER — Telehealth: Payer: Self-pay | Admitting: Gastroenterology

## 2022-07-05 NOTE — Telephone Encounter (Signed)
PT is calling to find out if she should continue with the 45mg  of Rinvoq or should her new refills be for 30mg . Requesting a call back

## 2022-07-05 NOTE — Telephone Encounter (Signed)
Returned call to patient. I clarified with patient that she should take Rinvoq 45 mg for a total of 8 weeks, then she will transition to 30 mg daily thereafter as her maintenance dose. Pt is aware that she has a years worth of refills for Rinvoq 45 mg. Pt verbalized understanding and had no concerns at the end of the call.

## 2022-07-11 ENCOUNTER — Other Ambulatory Visit (HOSPITAL_COMMUNITY): Payer: Self-pay

## 2022-07-11 ENCOUNTER — Encounter: Payer: Self-pay | Admitting: Oncology

## 2022-07-11 ENCOUNTER — Telehealth: Payer: Self-pay | Admitting: Pharmacy Technician

## 2022-07-11 NOTE — Telephone Encounter (Signed)
Patient Advocate Encounter  Received notification from [facility] that prior authorization for [facility] 30MG  is required.   PA submitted on 6.10.24 Key [id number] Status is pending

## 2022-07-12 ENCOUNTER — Other Ambulatory Visit (HOSPITAL_COMMUNITY): Payer: Self-pay

## 2022-07-12 NOTE — Telephone Encounter (Signed)
Patient Advocate Encounter  Prior Authorization for [facility] 30MG  has been approved with [facility].    PA# [id number]  Effective dates: 6.10.24 through 6.10.25  Per [facility] test claim, copay for 30 days supply is $4

## 2022-08-17 ENCOUNTER — Telehealth: Payer: Self-pay

## 2022-08-17 ENCOUNTER — Inpatient Hospital Stay: Payer: BLUE CROSS/BLUE SHIELD | Admitting: Physician Assistant

## 2022-08-17 ENCOUNTER — Inpatient Hospital Stay: Payer: BLUE CROSS/BLUE SHIELD | Attending: Physician Assistant

## 2022-08-17 ENCOUNTER — Other Ambulatory Visit: Payer: Self-pay

## 2022-08-17 ENCOUNTER — Other Ambulatory Visit: Payer: Self-pay | Admitting: Physician Assistant

## 2022-08-17 VITALS — BP 97/64 | HR 59 | Temp 98.2°F | Resp 16 | Wt 130.3 lb

## 2022-08-17 DIAGNOSIS — N920 Excessive and frequent menstruation with regular cycle: Secondary | ICD-10-CM | POA: Insufficient documentation

## 2022-08-17 DIAGNOSIS — D500 Iron deficiency anemia secondary to blood loss (chronic): Secondary | ICD-10-CM

## 2022-08-17 DIAGNOSIS — K5190 Ulcerative colitis, unspecified, without complications: Secondary | ICD-10-CM | POA: Diagnosis not present

## 2022-08-17 DIAGNOSIS — D509 Iron deficiency anemia, unspecified: Secondary | ICD-10-CM

## 2022-08-17 LAB — RETIC PANEL
Immature Retic Fract: 18 % — ABNORMAL HIGH (ref 2.3–15.9)
RBC.: 4.33 MIL/uL (ref 3.87–5.11)
Retic Count, Absolute: 47.2 10*3/uL (ref 19.0–186.0)
Retic Ct Pct: 1.1 % (ref 0.4–3.1)
Reticulocyte Hemoglobin: 30.5 pg (ref >27.9)

## 2022-08-17 LAB — CBC WITH DIFFERENTIAL (CANCER CENTER ONLY)
Abs Immature Granulocytes: 0.03 10*3/uL (ref 0.00–0.07)
Basophils Absolute: 0 10*3/uL (ref 0.0–0.1)
Basophils Relative: 1 %
Eosinophils Absolute: 0.1 10*3/uL (ref 0.0–0.5)
Eosinophils Relative: 1 %
HCT: 32.8 % — ABNORMAL LOW (ref 36.0–46.0)
Hemoglobin: 11.8 g/dL — ABNORMAL LOW (ref 12.0–15.0)
Immature Granulocytes: 1 %
Lymphocytes Relative: 23 %
Lymphs Abs: 1.3 10*3/uL (ref 0.7–4.0)
MCH: 27.4 pg (ref 26.0–34.0)
MCHC: 36 g/dL (ref 30.0–36.0)
MCV: 76.1 fL — ABNORMAL LOW (ref 80.0–100.0)
Monocytes Absolute: 0.4 10*3/uL (ref 0.1–1.0)
Monocytes Relative: 7 %
Neutro Abs: 3.7 10*3/uL (ref 1.7–7.7)
Neutrophils Relative %: 67 %
Platelet Count: 394 10*3/uL (ref 150–400)
RBC: 4.31 MIL/uL (ref 3.87–5.11)
RDW: 13.7 % (ref 11.5–15.5)
WBC Count: 5.4 10*3/uL (ref 4.0–10.5)
nRBC: 0 % (ref 0.0–0.2)

## 2022-08-17 LAB — IRON AND IRON BINDING CAPACITY (CC-WL,HP ONLY)
Iron: 73 ug/dL (ref 28–170)
Saturation Ratios: 22 % (ref 10.4–31.8)
TIBC: 340 ug/dL (ref 250–450)
UIBC: 267 ug/dL (ref 148–442)

## 2022-08-17 LAB — FERRITIN: Ferritin: 11 ng/mL (ref 11–307)

## 2022-08-17 NOTE — Telephone Encounter (Signed)
Pt advised of lab results and recommendations.  She is in agreement to this plan and is aware scheduling will call after insurance approval

## 2022-08-17 NOTE — Telephone Encounter (Signed)
-----   Message from [name] sent at 08/17/2022  2:58 PM EDT ----- Please notify patient that iron levels are low so we will arrange for IV iron to bolster levels.

## 2022-08-17 NOTE — Progress Notes (Signed)
[facility] [phone]   [phone]  PROGRESS NOTE  Patient Care Team: Pcp, No as PCP - General  CHIEF COMPLAINTS/PURPOSE OF CONSULTATION:  Iron deficiency anemia secondary to ulcerative colitis.   INTERVAL HISTORY:  [name] 37 y.o. female returns for a follow up for iron deficiency anemia. She was last seen by Dr. Clelia Croft on 02/11/2022. She presents today for a follow up.   On exam today, [name] reports increased fatigue recently and feeling sluggish. She has noticed heavier menstrual cycles lasting longer with each cycle. She reports each cycle lasts 5 days with 3 days of heavy bleeding. She denies nausea, vomiting or bowel habit changes. She reports occasional shortness of breath with exertion and dizzy spells. She denies fevers, chills, sweats, chest pain or cough. She has no other complaints. Rest of the ROS is below.   MEDICAL HISTORY:  Past Medical History:  Diagnosis Date   Anemia    Hypokalemia 12/09/2019   Hyponatremia 12/11/2019   Pre-diabetes    Ulcerative colitis (HCC)     SURGICAL HISTORY: Past Surgical History:  Procedure Laterality Date   BIOPSY  12/11/2019   Procedure: BIOPSY;  Surgeon: Kathi Der, MD;  Location: [facility];  Service: Gastroenterology;;   COLONOSCOPY N/A 12/11/2019   Procedure: COLONOSCOPY;  Surgeon: Kathi Der, MD;  Location: [facility];  Service: Gastroenterology;  Laterality: N/A;   COLONOSCOPY     NO PAST SURGERIES      SOCIAL HISTORY: Social History   Socioeconomic History   Marital status: Single    Spouse name: Not on file   Number of children: Not on file   Years of education: Not on file   Highest education level: Not on file  Occupational History   Not on file  Tobacco Use   Smoking status: Never   Smokeless tobacco: Never  Vaping Use   Vaping status: Never Used  Substance and Sexual Activity   Alcohol use: Yes    Comment: occ   Drug use: Yes     Frequency: 7.0 times per week    Types: Marijuana   Sexual activity: Yes    Birth control/protection: Injection  Other Topics Concern   Not on file  Social History Narrative   Not on file   Social Determinants of Health   Financial Resource Strain: Not on file  Food Insecurity: Not on file  Transportation Needs: Not on file  Physical Activity: Not on file  Stress: Not on file  Social Connections: Not on file  Intimate Partner Violence: Not on file    FAMILY HISTORY: Family History  Problem Relation Age of Onset   Hypertension Father    Diabetes Father    Stomach cancer Maternal Aunt    Diabetes Maternal Grandmother    Heart disease Maternal Grandfather    Diabetes Paternal Grandfather    Colon cancer Neg Hx    Esophageal cancer Neg Hx    Pancreatic cancer Neg Hx     ALLERGIES:  has No Known Allergies.  MEDICATIONS:  Current Outpatient Medications  Medication Sig Dispense Refill   Upadacitinib ER (RINVOQ) 30 MG TB24 Take 1 tablet (30 mg total) by mouth daily. MAINTENANCE DOSE 30 tablet 11   loperamide (IMODIUM A-D) 2 MG tablet Take 1 tablet (2 mg total) by mouth 4 (four) times daily as needed for diarrhea or loose stools. (Patient not taking: Reported on 05/13/2022) 30 tablet 0   metoCLOPramide (REGLAN) 10 MG tablet Take 10 mg  by mouth every 6 (six) hours as needed for nausea. (Patient not taking: Reported on 05/13/2022)     ondansetron (ZOFRAN ODT) 4 MG disintegrating tablet Take 1 tablet (4 mg total) by mouth every 8 (eight) hours as needed for nausea or vomiting. (Patient not taking: Reported on 08/17/2022) 30 tablet 0   Probiotic Product (PROBIOTIC BLEND) CAPS Take 1-2 capsules by mouth daily. (Patient not taking: Reported on 08/17/2022) 60 capsule 1   Upadacitinib ER (RINVOQ) 45 MG TB24 Take 1 tablet (45 mg total) by mouth daily. FOR 8 WEEKS - STARTER DOSE (Patient not taking: Reported on 08/17/2022) 56 tablet 0   No current facility-administered medications for this  visit.    REVIEW OF SYSTEMS:   Constitutional: ( - ) fevers, ( - )  chills , ( - ) night sweats Eyes: ( - ) blurriness of vision, ( - ) double vision, ( - ) watery eyes Ears, nose, mouth, throat, and face: ( - ) mucositis, ( - ) sore throat Respiratory: ( - ) cough, ( + ) dyspnea, ( - ) wheezes Cardiovascular: ( - ) palpitation, ( - ) chest discomfort, ( - ) lower extremity swelling Gastrointestinal:  ( - ) nausea, ( - ) heartburn, ( - ) change in bowel habits Skin: ( - ) abnormal skin rashes Lymphatics: ( - ) new lymphadenopathy, ( - ) easy bruising Neurological: ( - ) numbness, ( - ) tingling, ( - ) new weaknesses Behavioral/Psych: ( - ) mood change, ( - ) new changes  All other systems were reviewed with the patient and are negative.  PHYSICAL EXAMINATION: ECOG PERFORMANCE STATUS: 1 - Symptomatic but completely ambulatory  Vitals:   08/17/22 0959  BP: 97/64  Pulse: (!) 59  Resp: 16  Temp: 98.2 F (36.8 C)  SpO2: 100%   Filed Weights   08/17/22 0959  Weight: 130 lb 4.8 oz (59.1 kg)    GENERAL: well appearing female in NAD  SKIN: skin color, texture, turgor are normal, no rashes or significant lesions EYES: conjunctiva are pink and non-injected, sclera clear LUNGS: clear to auscultation and percussion with normal breathing effort HEART: regular rate & rhythm and no murmurs and no lower extremity edema Musculoskeletal: no cyanosis of digits and no clubbing  PSYCH: alert & oriented x 3, fluent speech NEURO: no focal motor/sensory deficits  LABORATORY DATA:  I have reviewed the data as listed    Latest Ref Rng & Units 08/17/2022    9:26 AM 02/11/2022    2:03 PM 10/22/2021    4:40 PM  CBC  WBC 4.0 - 10.5 K/uL 5.4  8.8  10.4   Hemoglobin 12.0 - 15.0 g/dL 16.1  09.6  04.5   Hematocrit 36.0 - 46.0 % 32.8  34.9  35.6   Platelets 150 - 400 K/uL 394  444  380.0        Latest Ref Rng & Units 07/14/2021   11:00 AM 03/01/2021    3:51 PM 12/02/2020    1:31 PM  CMP  Glucose  70 - 99 mg/dL 409  811  92   BUN 6 - 23 mg/dL 9  8  10    Creatinine 0.40 - 1.20 mg/dL 9.14  7.82  9.56   Sodium 135 - 145 mEq/L 136  133  133   Potassium 3.5 - 5.1 mEq/L 3.3  3.6  3.5   Chloride 96 - 112 mEq/L 103  99  102   CO2 19 - 32 mEq/L 29  28  24   Calcium 8.4 - 10.5 mg/dL 8.3  7.9  8.5   Total Protein 6.0 - 8.3 g/dL 7.3  7.0  7.0   Total Bilirubin 0.2 - 1.2 mg/dL 0.3  0.3  0.4   Alkaline Phos 39 - 117 U/L 67  111  80   AST 0 - 37 U/L 9  13  13    ALT 0 - 35 U/L 6  7  9      ASSESSMENT & PLAN [name] is a 37 y.o. female who presents for a follow up for iron deficiency anemia.   #Iron deficiency anemia 2/2 ulcerative colitis and menorrhagia: --Last received IV venofer 200 mg x 3 doses from 08/23/2021-09/06/2021. --Labs from today show mild anemia with Hgb 11.8, MCV 76.1. Iron panel shows ferritin 11, saturation 22%, TIBC 340.  --Recommend IV iron to bolster iron levels. --Patient takes PO iron therapy periodically when she remembers. Advised that patient can discontinue PO iron therapy since iron is unlikely absorbing much due to her UC.  --Patient plants to follow up with OB/GYN to discuss interventions to improve her menorrhagia.  --RTC in 8 weeks with labs  #Ulcerative colitis: --Under the care of [facility], last seen by Dr. Barron Alvine on 05/13/2022.  --Currently on Rinvoq 30 mg daily.   No orders of the defined types were placed in this encounter.   All questions were answered. The patient knows to call the clinic with any problems, questions or concerns.  I have spent a total of 30 minutes minutes of face-to-face and non-face-to-face time, preparing to see the patient, performing a medically appropriate examination, counseling and educating the patient, ordering medications/tests/procedures, documenting clinical information in the electronic health record, and care coordination.   Georga Kaufmann, PA-C Department of Hematology/Oncology [facility] at  [facility] Phone: [phone]

## 2022-08-18 ENCOUNTER — Telehealth: Payer: Self-pay | Admitting: Physician Assistant

## 2022-08-18 NOTE — Telephone Encounter (Signed)
Patient is aware of upcoming appointment times/dates.  

## 2022-08-23 ENCOUNTER — Encounter: Payer: Self-pay | Admitting: Gastroenterology

## 2022-08-23 ENCOUNTER — Ambulatory Visit: Payer: BLUE CROSS/BLUE SHIELD | Admitting: Gastroenterology

## 2022-08-23 ENCOUNTER — Other Ambulatory Visit (INDEPENDENT_AMBULATORY_CARE_PROVIDER_SITE_OTHER): Payer: BLUE CROSS/BLUE SHIELD

## 2022-08-23 ENCOUNTER — Ambulatory Visit (INDEPENDENT_AMBULATORY_CARE_PROVIDER_SITE_OTHER): Payer: BLUE CROSS/BLUE SHIELD | Admitting: Gastroenterology

## 2022-08-23 VITALS — BP 116/76 | HR 60 | Ht 59.0 in | Wt 130.2 lb

## 2022-08-23 DIAGNOSIS — K5100 Ulcerative (chronic) pancolitis without complications: Secondary | ICD-10-CM | POA: Diagnosis not present

## 2022-08-23 DIAGNOSIS — D509 Iron deficiency anemia, unspecified: Secondary | ICD-10-CM | POA: Diagnosis not present

## 2022-08-23 LAB — HEPATIC FUNCTION PANEL
ALT: 10 U/L (ref 0–35)
AST: 17 U/L (ref 0–37)
Albumin: 4 g/dL (ref 3.5–5.2)
Alkaline Phosphatase: 51 U/L (ref 39–117)
Bilirubin, Direct: 0.1 mg/dL (ref 0.0–0.3)
Total Bilirubin: 0.3 mg/dL (ref 0.2–1.2)
Total Protein: 7.7 g/dL (ref 6.0–8.3)

## 2022-08-23 LAB — LIPID PANEL
Cholesterol: 174 mg/dL (ref 0–200)
HDL: 69.3 mg/dL (ref >39.00)
LDL Cholesterol: 96 mg/dL (ref 0–99)
NonHDL: 104.91
Total CHOL/HDL Ratio: 3
Triglycerides: 43 mg/dL (ref 0.0–149.0)
VLDL: 8.6 mg/dL (ref 0.0–40.0)

## 2022-08-23 LAB — SEDIMENTATION RATE: Sed Rate: 22 mm/hr — ABNORMAL HIGH (ref 0–20)

## 2022-08-23 LAB — C-REACTIVE PROTEIN: CRP: <1 mg/dL (ref 0.5–20.0)

## 2022-08-23 NOTE — Progress Notes (Signed)
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  Was treated with prednisone and she reports having clinical improvement during the 4 weeks that she took this medication.   - 12/2019:  normal/negative QuantiFERON gold.  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 HBsAg-. - She never followed up with Dr. Levora Angel at [facility], and transitioned to "herbal remedies". - 12/2020: Hospital admission with UC flare.  Albumin 1.4, H/H 10/30, PLT 615.  Treated with prednisone with plan to transition to biologic therapy as outpatient (did not want to start immunomodulator due to intermediate TPMT level).  CRP 4.8, ESR 64, GI PCR panel negative - 01/2021: Follow-up with [facility].  Had planned to initiate Remicade, but her insurance changed and can no longer follow with [facility] - 03/04/2021: CT A/P: Marked diffuse colorectal wall thickening and mucosal hyperenhancement with loss of haustral folds and slight haziness of pericolonic fat worse compared with 12/2019 c/w UC pancolitis - 07/14/2021: Established outpatient care with [facility].  Was having breakthrough symptoms at prednisone 20 mg/day with up to 10 BM/day, rectal bleeding, diarrhea.  QuantiFERON gold negative.  ESR 82, CRP 2.4, H/H 7.5/24 with MCV/RDW 56/19, ferritin 12.7, iron 13, TIBC 344, sat 3.8%.  B12 364, folate 7.8.  Was referred for IV iron, initiating approval for Remicade - 08/04/2021: Evaluation in the Hematology clinic.  Started IV Venofer - 09/08/2021: GI follow-up.  Reported clinical improvement since initiation of Remicade with 1 BM/day, occasional BRBPR but improved.  Was still on prednisone 20 mg/day; started weaning. - 10/22/2021: GI follow-up.  Reports having flare of symptoms starting in mid August despite Remicade infusions with increased urgency, nocturnal stools.  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  Usually symptoms improved after starting prednisone, but recurrence of symptoms when prednisone tapers below 20 mg/day. 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  Started Rinvoq.     Endoscopic History: - Colonoscopy (12/2019, Dr. Levora Angel): Severe Mayo 3 inflammation from rectum to cecum.  Normal TI  HPI:     Patient is a 37 y.o. female presenting to the Gastroenterology Clinic for follow-up.  Was last seen by me on 05/13/2022, and started Rinvoq that week.  She completed Rinvoq 45 mg daily x 8 weeks, then transitioned to 30 mg daily last month.  Had follow-up in the Hematology Clinic on 08/17/2022.  Did report menorrhagia.  Repeat labs last week with H/H 11.8/32 point, MCV 76, RDW 13.7, ferritin 11, iron 73, sat 22%, TIBC 340.  Was given IV iron to bolster iron levels (1 of 3 treatments).  Discontinued oral iron given suspected poor absorption.  Recommended follow-up with OB/GYN re menorrhagia.  Today, she states she has had a great response to Rinvoq. Does have increased gas, but otherwise bowel habits have returned to baseline, no hematochezia.      Latest Ref Rng & Units 08/17/2022    9:26 AM 02/11/2022    2:03 PM 10/22/2021    4:40 PM  CBC  WBC 4.0 - 10.5 K/uL 5.4  8.8  10.4   Hemoglobin 12.0 - 15.0 g/dL 16.1  09.6  04.5   Hematocrit 36.0 - 46.0 % 32.8  34.9  35.6   Platelets 150 - 400  K/uL 394  444  380.0      Review of systems:     No chest pain, no SOB, no fevers, no urinary sx   Past Medical History:  Diagnosis Date   Anemia    Hypokalemia 12/09/2019   Hyponatremia 12/11/2019   Pre-diabetes    Ulcerative colitis (HCC)     Patient's surgical history, family medical history, social history, medications and allergies were all reviewed in Epic     Current Outpatient Medications  Medication Sig Dispense Refill   loperamide (IMODIUM A-D) 2 MG tablet Take 1 tablet (2 mg total) by mouth 4 (four) times daily as needed for diarrhea or loose stools. (Patient not taking: Reported on 05/13/2022) 30 tablet 0   metoCLOPramide (REGLAN) 10 MG tablet Take 10 mg by mouth every 6 (six) hours as needed for nausea. (Patient not taking: Reported on 05/13/2022)     ondansetron (ZOFRAN ODT) 4 MG disintegrating tablet Take 1 tablet (4 mg total) by mouth every 8 (eight) hours as needed for nausea or vomiting. (Patient not taking: Reported on 08/17/2022) 30 tablet 0   Probiotic Product (PROBIOTIC BLEND) CAPS Take 1-2 capsules by mouth daily. (Patient not taking: Reported on 08/17/2022) 60 capsule 1   Upadacitinib ER (RINVOQ) 30 MG TB24 Take 1 tablet (30 mg total) by mouth daily. MAINTENANCE DOSE 30 tablet 11   Upadacitinib ER (RINVOQ) 45 MG TB24 Take 1 tablet (45 mg total) by mouth daily. FOR 8 WEEKS - STARTER DOSE (Patient not taking: Reported on 08/17/2022) 56 tablet 0   No current facility-administered medications for this visit.    Physical Exam:     There were no vitals taken for this visit.  GENERAL:  Pleasant female in NAD PSYCH: : Cooperative, normal affect NEURO: Alert and oriented x 3, no focal neurologic deficits   IMPRESSION and PLAN:    1) Ulcerative Colitis (Pancolitis): 37 year old female with a history of steroid responsive pan ulcerative colitis diagnosed 12/2019, eventually started on infliximab in 08/2021.  Never fully responded to infliximab (primary nonresponder), and labs confirmed presence of anti-infliximab antibody (74) and low drug level (0.8), so was changed to Rinvoq and 05/2022.  Completed 8 weeks at 45 mg daily with good clinical response, then reduced to 30 mg daily for maintenance.  Continues to do well at 30 mg daily dosing.   - Continue Rinvoq 30 mg daily  - Recommend Shingrix vaccine. Will obtain through [facility] -  Dermatology referral for annual exam (she never went). Will place another referral today - Repeat CBC check last week with largely stable H/H, normal WBC - Check LFT panel and lipid panel (fasting) - Avoid live vaccines - Check ESR/CRP today as a surrogate marker of response to therapy - Repeat colonoscopy in Nov/Dec to evaluate for deep remission - MVI daily     2) Iron deficiency anemia - Good response to IV iron with normalization of hemoglobin and iron panel in 01/2022.  Ferritin was still low normal at 11 last week, and was set up for another dose of IV iron by Hematology. - Continue follow-up in the Hematology clinic  RTC in 6-12 months or sooner prn      I spent 30 minutes of time, including in depth chart review, independent review of results as outlined above, communicating results with the patient directly, face-to-face time with the patient, coordinating care, and ordering studies and medications as appropriate, and documentation.       Verlin Dike Prinston Kynard ,DO, FACG 08/23/2022,  8:08 AM

## 2022-08-23 NOTE — Patient Instructions (Addendum)
Your provider has requested that you go to the basement level for lab work before leaving today. Press "B" on the elevator. The lab is located at the first door on the left as you exit the elevator.   You are referred to Dermatology today.  Please follow up in 1 year. Give [place] a call at [phone] to schedule an appointment.   You are due for a colonoscopy in November/December.   ______________________________________________________  If your blood pressure at your visit was 140/90 or greater, please contact your primary care physician to follow up on this.  _______________________________________________________ If you are age 37 or younger, your body mass index should be between 19-25. Your Body mass index is 26.31 kg/m. If this is out of the aformentioned range listed, please consider follow up with your Primary Care Provider.   __________________________________________________________  The [facility] providers would like to encourage you to use [facility] to communicate with providers for non-urgent requests or questions.  Due to long hold times on the telephone, sending your provider a message by [facility] may be a faster and more efficient way to get a response.  Please allow 48 business hours for a response.  Please remember that this is for non-urgent requests.   Due to recent changes in healthcare laws, you may see the results of your imaging and laboratory studies on MyChart before your provider has had a chance to review them.  We understand that in some cases there may be results that are confusing or concerning to you. Not all laboratory results come back in the same time frame and the provider may be waiting for multiple results in order to interpret others.  Please give [place] 48 hours in order for your provider to thoroughly review all the results before contacting the office for clarification of your results.     Thank you for choosing me and [facility].  Vito  Cirigliano, D.O.

## 2022-08-29 ENCOUNTER — Inpatient Hospital Stay: Payer: BLUE CROSS/BLUE SHIELD

## 2022-08-29 ENCOUNTER — Other Ambulatory Visit: Payer: Self-pay | Admitting: Physician Assistant

## 2022-08-29 ENCOUNTER — Other Ambulatory Visit: Payer: Self-pay

## 2022-08-29 VITALS — BP 96/64 | HR 57 | Temp 98.3°F | Resp 18

## 2022-08-29 DIAGNOSIS — D509 Iron deficiency anemia, unspecified: Secondary | ICD-10-CM

## 2022-08-29 DIAGNOSIS — R001 Bradycardia, unspecified: Secondary | ICD-10-CM

## 2022-08-29 MED ORDER — SODIUM CHLORIDE 0.9 % IV SOLN: Freq: Once | INTRAVENOUS | Status: AC

## 2022-08-29 MED ORDER — SODIUM CHLORIDE 0.9 % IV SOLN
200.0000 mg | Freq: Once | INTRAVENOUS | Status: AC
  Administered 2022-08-29: 200 mg via INTRAVENOUS
  Filled 2022-08-29: qty 200

## 2022-08-29 NOTE — Patient Instructions (Signed)
Iron Sucrose Injection What is this medication? IRON SUCROSE (EYE ern SOO krose) treats low levels of iron (iron deficiency anemia) in people with kidney disease. Iron is a mineral that plays an important role in making red blood cells, which carry oxygen from your lungs to the rest of your body. This medicine may be used for other purposes; ask your health care provider or pharmacist if you have questions. COMMON BRAND NAME(S): Venofer What should I tell my care team before I take this medication? They need to know if you have any of these conditions: Anemia not caused by low iron levels Heart disease High levels of iron in the blood Kidney disease Liver disease An unusual or allergic reaction to iron, other medications, foods, dyes, or preservatives Pregnant or trying to get pregnant Breastfeeding How should I use this medication? This medication is for infusion into a vein. It is given in a hospital or clinic setting. Talk to your care team about the use of this medication in children. While this medication may be prescribed for children as young as 2 years for selected conditions, precautions do apply. Overdosage: If you think you have taken too much of this medicine contact a poison control center or emergency room at once. NOTE: This medicine is only for you. Do not share this medicine with others. What if I miss a dose? Keep appointments for follow-up doses. It is important not to miss your dose. Call your care team if you are unable to keep an appointment. What may interact with this medication? Do not take this medication with any of the following: Deferoxamine Dimercaprol Other iron products This medication may also interact with the following: Chloramphenicol Deferasirox This list may not describe all possible interactions. Give your health care provider a list of all the medicines, herbs, non-prescription drugs, or dietary supplements you use. Also tell them if you smoke,  drink alcohol, or use illegal drugs. Some items may interact with your medicine. What should I watch for while using this medication? Visit your care team regularly. Tell your care team if your symptoms do not start to get better or if they get worse. You may need blood work done while you are taking this medication. You may need to follow a special diet. Talk to your care team. Foods that contain iron include: whole grains/cereals, dried fruits, beans, or peas, leafy green vegetables, and organ meats (liver, kidney). What side effects may I notice from receiving this medication? Side effects that you should report to your care team as soon as possible: Allergic reactions--skin rash, itching, hives, swelling of the face, lips, tongue, or throat Low blood pressure--dizziness, feeling faint or lightheaded, blurry vision Shortness of breath Side effects that usually do not require medical attention (report to your care team if they continue or are bothersome): Flushing Headache Joint pain Muscle pain Nausea Pain, redness, or irritation at injection site This list may not describe all possible side effects. Call your doctor for medical advice about side effects. You may report side effects to FDA at 1-800-FDA-1088. Where should I keep my medication? This medication is given in a hospital or clinic. It will not be stored at home. NOTE: This sheet is a summary. It may not cover all possible information. If you have questions about this medicine, talk to your doctor, pharmacist, or health care provider.  2024 Elsevier/Gold Standard (2022-06-24 00:00:00)

## 2022-08-29 NOTE — Progress Notes (Signed)
Patient evaluated in the Infusion Center for bradycardia. While RN was checking vitals she noticed HR in the 50s. On my evaluation patient reports feeling fine, just fatigued which has been an ongoing symptom of her anemia.  Radial pulse palpated and is 62, occasional PVC appreciated. EKG performed and shows sinus rhythm with premature supraventricular complexes. Patient denies feeling lightheaded, syncope/presyncope, or palpitations.Reviewed her medication list which includes Reglan. She is not currently taking it therefore I will remove it.  Patient will receive iron infusion today as scheduled. Ambulatory referral sent to cardiology. Strict ED precautions discussed should symptoms worsen.

## 2022-08-30 ENCOUNTER — Encounter: Payer: Self-pay | Admitting: Physician Assistant

## 2022-09-07 ENCOUNTER — Other Ambulatory Visit: Payer: Self-pay

## 2022-09-07 ENCOUNTER — Inpatient Hospital Stay: Payer: BLUE CROSS/BLUE SHIELD | Attending: Physician Assistant

## 2022-09-07 VITALS — BP 92/58 | HR 74 | Temp 98.1°F | Resp 18

## 2022-09-07 DIAGNOSIS — D509 Iron deficiency anemia, unspecified: Secondary | ICD-10-CM

## 2022-09-07 DIAGNOSIS — D500 Iron deficiency anemia secondary to blood loss (chronic): Secondary | ICD-10-CM | POA: Diagnosis present

## 2022-09-07 DIAGNOSIS — N920 Excessive and frequent menstruation with regular cycle: Secondary | ICD-10-CM | POA: Diagnosis not present

## 2022-09-07 DIAGNOSIS — K5190 Ulcerative colitis, unspecified, without complications: Secondary | ICD-10-CM | POA: Diagnosis not present

## 2022-09-07 MED ORDER — SODIUM CHLORIDE 0.9 % IV SOLN: Freq: Once | INTRAVENOUS | Status: AC

## 2022-09-07 MED ORDER — SODIUM CHLORIDE 0.9 % IV SOLN
200.0000 mg | Freq: Once | INTRAVENOUS | Status: AC
  Administered 2022-09-07: 200 mg via INTRAVENOUS
  Filled 2022-09-07: qty 200

## 2022-09-07 NOTE — Progress Notes (Signed)
Pt declined 30 min post iron observation. VSS at discharge

## 2022-09-07 NOTE — Patient Instructions (Signed)
Iron Sucrose Injection What is this medication? IRON SUCROSE (EYE ern SOO krose) treats low levels of iron (iron deficiency anemia) in people with kidney disease. Iron is a mineral that plays an important role in making red blood cells, which carry oxygen from your lungs to the rest of your body. This medicine may be used for other purposes; ask your health care provider or pharmacist if you have questions. COMMON BRAND NAME(S): Venofer What should I tell my care team before I take this medication? They need to know if you have any of these conditions: Anemia not caused by low iron levels Heart disease High levels of iron in the blood Kidney disease Liver disease An unusual or allergic reaction to iron, other medications, foods, dyes, or preservatives Pregnant or trying to get pregnant Breastfeeding How should I use this medication? This medication is for infusion into a vein. It is given in a hospital or clinic setting. Talk to your care team about the use of this medication in children. While this medication may be prescribed for children as young as 2 years for selected conditions, precautions do apply. Overdosage: If you think you have taken too much of this medicine contact a poison control center or emergency room at once. NOTE: This medicine is only for you. Do not share this medicine with others. What if I miss a dose? Keep appointments for follow-up doses. It is important not to miss your dose. Call your care team if you are unable to keep an appointment. What may interact with this medication? Do not take this medication with any of the following: Deferoxamine Dimercaprol Other iron products This medication may also interact with the following: Chloramphenicol Deferasirox This list may not describe all possible interactions. Give your health care provider a list of all the medicines, herbs, non-prescription drugs, or dietary supplements you use. Also tell them if you smoke,  drink alcohol, or use illegal drugs. Some items may interact with your medicine. What should I watch for while using this medication? Visit your care team regularly. Tell your care team if your symptoms do not start to get better or if they get worse. You may need blood work done while you are taking this medication. You may need to follow a special diet. Talk to your care team. Foods that contain iron include: whole grains/cereals, dried fruits, beans, or peas, leafy green vegetables, and organ meats (liver, kidney). What side effects may I notice from receiving this medication? Side effects that you should report to your care team as soon as possible: Allergic reactions--skin rash, itching, hives, swelling of the face, lips, tongue, or throat Low blood pressure--dizziness, feeling faint or lightheaded, blurry vision Shortness of breath Side effects that usually do not require medical attention (report to your care team if they continue or are bothersome): Flushing Headache Joint pain Muscle pain Nausea Pain, redness, or irritation at injection site This list may not describe all possible side effects. Call your doctor for medical advice about side effects. You may report side effects to FDA at 1-800-FDA-1088. Where should I keep my medication? This medication is given in a hospital or clinic. It will not be stored at home. NOTE: This sheet is a summary. It may not cover all possible information. If you have questions about this medicine, talk to your doctor, pharmacist, or health care provider.  2024 Elsevier/Gold Standard (2022-06-24 00:00:00)

## 2022-09-13 ENCOUNTER — Inpatient Hospital Stay: Payer: BLUE CROSS/BLUE SHIELD

## 2022-09-13 ENCOUNTER — Other Ambulatory Visit: Payer: Self-pay

## 2022-09-13 VITALS — BP 95/62 | HR 47 | Temp 98.1°F | Resp 16

## 2022-09-13 DIAGNOSIS — D500 Iron deficiency anemia secondary to blood loss (chronic): Secondary | ICD-10-CM | POA: Diagnosis not present

## 2022-09-13 DIAGNOSIS — D509 Iron deficiency anemia, unspecified: Secondary | ICD-10-CM

## 2022-09-13 MED ORDER — SODIUM CHLORIDE 0.9 % IV SOLN: Freq: Once | INTRAVENOUS | Status: AC

## 2022-09-13 MED ORDER — SODIUM CHLORIDE 0.9 % IV SOLN
200.0000 mg | Freq: Once | INTRAVENOUS | Status: AC
  Administered 2022-09-13: 200 mg via INTRAVENOUS
  Filled 2022-09-13: qty 200

## 2022-09-13 NOTE — Progress Notes (Signed)
Pt SBP in the upper 80's when finished with iron infusion. Pt reports no s/sx. PA ordered fluid bolus and SBP increased to low 90's. HR remains 46-60. Pt still reporting no s/sx of low BP. Pt states she has an appointment with cardiologist in October to address BP and HR. Pt discharged in stable condtion.

## 2022-09-13 NOTE — Patient Instructions (Signed)
 Iron Sucrose Injection What is this medication? IRON SUCROSE (EYE ern SOO krose) treats low levels of iron (iron deficiency anemia) in people with kidney disease. Iron is a mineral that plays an important role in making red blood cells, which carry oxygen from your lungs to the rest of your body. This medicine may be used for other purposes; ask your health care provider or pharmacist if you have questions. COMMON BRAND NAME(S): Venofer What should I tell my care team before I take this medication? They need to know if you have any of these conditions: Anemia not caused by low iron levels Heart disease High levels of iron in the blood Kidney disease Liver disease An unusual or allergic reaction to iron, other medications, foods, dyes, or preservatives Pregnant or trying to get pregnant Breastfeeding How should I use this medication? This medication is for infusion into a vein. It is given in a hospital or clinic setting. Talk to your care team about the use of this medication in children. While this medication may be prescribed for children as young as 2 years for selected conditions, precautions do apply. Overdosage: If you think you have taken too much of this medicine contact a poison control center or emergency room at once. NOTE: This medicine is only for you. Do not share this medicine with others. What if I miss a dose? Keep appointments for follow-up doses. It is important not to miss your dose. Call your care team if you are unable to keep an appointment. What may interact with this medication? Do not take this medication with any of the following: Deferoxamine Dimercaprol Other iron products This medication may also interact with the following: Chloramphenicol Deferasirox This list may not describe all possible interactions. Give your health care provider a list of all the medicines, herbs, non-prescription drugs, or dietary supplements you use. Also tell them if you smoke,  drink alcohol, or use illegal drugs. Some items may interact with your medicine. What should I watch for while using this medication? Visit your care team regularly. Tell your care team if your symptoms do not start to get better or if they get worse. You may need blood work done while you are taking this medication. You may need to follow a special diet. Talk to your care team. Foods that contain iron include: whole grains/cereals, dried fruits, beans, or peas, leafy green vegetables, and organ meats (liver, kidney). What side effects may I notice from receiving this medication? Side effects that you should report to your care team as soon as possible: Allergic reactions--skin rash, itching, hives, swelling of the face, lips, tongue, or throat Low blood pressure--dizziness, feeling faint or lightheaded, blurry vision Shortness of breath Side effects that usually do not require medical attention (report to your care team if they continue or are bothersome): Flushing Headache Joint pain Muscle pain Nausea Pain, redness, or irritation at injection site This list may not describe all possible side effects. Call your doctor for medical advice about side effects. You may report side effects to FDA at 1-800-FDA-1088. Where should I keep my medication? This medication is given in a hospital or clinic. It will not be stored at home. NOTE: This sheet is a summary. It may not cover all possible information. If you have questions about this medicine, talk to your doctor, pharmacist, or health care provider.  2024 Elsevier/Gold Standard (2022-06-24 00:00:00)

## 2022-10-16 ENCOUNTER — Other Ambulatory Visit: Payer: Self-pay | Admitting: Physician Assistant

## 2022-10-17 ENCOUNTER — Encounter: Payer: Self-pay | Admitting: Physician Assistant

## 2022-10-18 ENCOUNTER — Inpatient Hospital Stay: Payer: BLUE CROSS/BLUE SHIELD | Attending: Physician Assistant

## 2022-10-18 ENCOUNTER — Inpatient Hospital Stay (HOSPITAL_BASED_OUTPATIENT_CLINIC_OR_DEPARTMENT_OTHER): Payer: BLUE CROSS/BLUE SHIELD | Admitting: Physician Assistant

## 2022-10-18 ENCOUNTER — Telehealth: Payer: Self-pay

## 2022-10-18 VITALS — BP 93/60 | HR 63 | Temp 97.7°F | Resp 13 | Wt 130.8 lb

## 2022-10-18 DIAGNOSIS — R001 Bradycardia, unspecified: Secondary | ICD-10-CM | POA: Diagnosis not present

## 2022-10-18 DIAGNOSIS — F1290 Cannabis use, unspecified, uncomplicated: Secondary | ICD-10-CM | POA: Insufficient documentation

## 2022-10-18 DIAGNOSIS — D500 Iron deficiency anemia secondary to blood loss (chronic): Secondary | ICD-10-CM | POA: Insufficient documentation

## 2022-10-18 DIAGNOSIS — K5190 Ulcerative colitis, unspecified, without complications: Secondary | ICD-10-CM | POA: Diagnosis not present

## 2022-10-18 DIAGNOSIS — D509 Iron deficiency anemia, unspecified: Secondary | ICD-10-CM | POA: Diagnosis not present

## 2022-10-18 DIAGNOSIS — N920 Excessive and frequent menstruation with regular cycle: Secondary | ICD-10-CM | POA: Diagnosis not present

## 2022-10-18 LAB — CBC WITH DIFFERENTIAL (CANCER CENTER ONLY)
Abs Immature Granulocytes: 0.03 10*3/uL (ref 0.00–0.07)
Basophils Absolute: 0.1 10*3/uL (ref 0.0–0.1)
Basophils Relative: 1 %
Eosinophils Absolute: 0.1 10*3/uL (ref 0.0–0.5)
Eosinophils Relative: 1 %
HCT: 35.4 % — ABNORMAL LOW (ref 36.0–46.0)
Hemoglobin: 12.4 g/dL (ref 12.0–15.0)
Immature Granulocytes: 0 %
Lymphocytes Relative: 20 %
Lymphs Abs: 1.4 10*3/uL (ref 0.7–4.0)
MCH: 27.4 pg (ref 26.0–34.0)
MCHC: 35 g/dL (ref 30.0–36.0)
MCV: 78.3 fL — ABNORMAL LOW (ref 80.0–100.0)
Monocytes Absolute: 0.6 10*3/uL (ref 0.1–1.0)
Monocytes Relative: 9 %
Neutro Abs: 5.1 10*3/uL (ref 1.7–7.7)
Neutrophils Relative %: 69 %
Platelet Count: 310 10*3/uL (ref 150–400)
RBC: 4.52 MIL/uL (ref 3.87–5.11)
RDW: 13.2 % (ref 11.5–15.5)
WBC Count: 7.3 10*3/uL (ref 4.0–10.5)
nRBC: 0 % (ref 0.0–0.2)

## 2022-10-18 LAB — IRON AND IRON BINDING CAPACITY (CC-WL,HP ONLY)
Iron: 93 ug/dL (ref 28–170)
Saturation Ratios: 29 % (ref 10.4–31.8)
TIBC: 321 ug/dL (ref 250–450)
UIBC: 228 ug/dL (ref 148–442)

## 2022-10-18 LAB — FERRITIN: Ferritin: 101 ng/mL (ref 11–307)

## 2022-10-18 NOTE — Progress Notes (Signed)
[facility] Telephone:(336) [phone]   Fax:([phone]) [phone]  PROGRESS NOTE  Patient Care Team: Pcp, No as PCP - General  CHIEF COMPLAINTS/PURPOSE OF CONSULTATION:  Iron deficiency anemia secondary to ulcerative colitis.   INTERVAL HISTORY:  [name] 37 y.o. female returns for a follow up for iron deficiency anemia. She was last seen on 08/17/2022. In the interim, she received IV venofer 200 mg x 3 doses. She is unaccompanied for this visit.   On exam today, [name] reports her energy levels have improved but she still feels sluggish occasionally. She reports outside of menstrual cycles, she has no other signs of bleeding. She denies nausea, vomiting or bowel habit changes. She denies fevers, chills, sweats, shortness of breath, chest pain or cough. She has no other complaints. Rest of the ROS is below.   MEDICAL HISTORY:  Past Medical History:  Diagnosis Date   Anemia    Hypokalemia 12/09/2019   Hyponatremia 12/11/2019   Pre-diabetes    Ulcerative colitis (HCC)     SURGICAL HISTORY: Past Surgical History:  Procedure Laterality Date   BIOPSY  12/11/2019   Procedure: BIOPSY;  Surgeon: Kathi Der, MD;  Location: [facility];  Service: Gastroenterology;;   COLONOSCOPY N/A 12/11/2019   Procedure: COLONOSCOPY;  Surgeon: Kathi Der, MD;  Location: [facility];  Service: Gastroenterology;  Laterality: N/A;   COLONOSCOPY     NO PAST SURGERIES      SOCIAL HISTORY: Social History   Socioeconomic History   Marital status: Single    Spouse name: Not on file   Number of children: Not on file   Years of education: Not on file   Highest education level: Not on file  Occupational History   Not on file  Tobacco Use   Smoking status: Never   Smokeless tobacco: Never  Vaping Use   Vaping status: Never Used  Substance and Sexual Activity   Alcohol use: Yes    Comment: occ   Drug use: Yes    Frequency: 7.0 times per week    Types: Marijuana    Sexual activity: Yes    Birth control/protection: Injection  Other Topics Concern   Not on file  Social History Narrative   Not on file   Social Determinants of Health   Financial Resource Strain: Not on file  Food Insecurity: Not on file  Transportation Needs: Not on file  Physical Activity: Not on file  Stress: Not on file  Social Connections: Not on file  Intimate Partner Violence: Not on file    FAMILY HISTORY: Family History  Problem Relation Age of Onset   Hypertension Father    Diabetes Father    Stomach cancer Maternal Aunt    Diabetes Maternal Grandmother    Heart disease Maternal Grandfather    Diabetes Paternal Grandfather    Colon cancer Neg Hx    Esophageal cancer Neg Hx    Pancreatic cancer Neg Hx     ALLERGIES:  has No Known Allergies.  MEDICATIONS:  Current Outpatient Medications  Medication Sig Dispense Refill   ondansetron (ZOFRAN ODT) 4 MG disintegrating tablet Take 1 tablet (4 mg total) by mouth every 8 (eight) hours as needed for nausea or vomiting. 30 tablet 0   Upadacitinib ER (RINVOQ) 30 MG TB24 Take 1 tablet (30 mg total) by mouth daily. MAINTENANCE DOSE 30 tablet 11   loperamide (IMODIUM A-D) 2 MG tablet Take 1 tablet (2 mg total) by mouth 4 (four) times daily as needed  for diarrhea or loose stools. (Patient not taking: Reported on 05/13/2022) 30 tablet 0   Probiotic Product (PROBIOTIC BLEND) CAPS Take 1-2 capsules by mouth daily. (Patient not taking: Reported on 08/17/2022) 60 capsule 1   Upadacitinib ER (RINVOQ) 45 MG TB24 Take 1 tablet (45 mg total) by mouth daily. FOR 8 WEEKS - STARTER DOSE 56 tablet 0   No current facility-administered medications for this visit.    REVIEW OF SYSTEMS:   Constitutional: ( - ) fevers, ( - )  chills , ( - ) night sweats Eyes: ( - ) blurriness of vision, ( - ) double vision, ( - ) watery eyes Ears, nose, mouth, throat, and face: ( - ) mucositis, ( - ) sore throat Respiratory: ( - ) cough, ( + ) dyspnea, ( - )  wheezes Cardiovascular: ( - ) palpitation, ( - ) chest discomfort, ( - ) lower extremity swelling Gastrointestinal:  ( - ) nausea, ( - ) heartburn, ( - ) change in bowel habits Skin: ( - ) abnormal skin rashes Lymphatics: ( - ) new lymphadenopathy, ( - ) easy bruising Neurological: ( - ) numbness, ( - ) tingling, ( - ) new weaknesses Behavioral/Psych: ( - ) mood change, ( - ) new changes  All other systems were reviewed with the patient and are negative.  PHYSICAL EXAMINATION: ECOG PERFORMANCE STATUS: 1 - Symptomatic but completely ambulatory  Vitals:   10/18/22 0851  BP: 93/60  Pulse: 63  Resp: 13  Temp: 97.7 F (36.5 C)  SpO2: 100%   Filed Weights   10/18/22 0851  Weight: 130 lb 12.8 oz (59.3 kg)    GENERAL: well appearing female in NAD  SKIN: skin color, texture, turgor are normal, no rashes or significant lesions EYES: conjunctiva are pink and non-injected, sclera clear LUNGS: clear to auscultation and percussion with normal breathing effort HEART: regular rate & rhythm and no murmurs and no lower extremity edema Musculoskeletal: no cyanosis of digits and no clubbing  PSYCH: alert & oriented x 3, fluent speech NEURO: no focal motor/sensory deficits  LABORATORY DATA:  I have reviewed the data as listed    Latest Ref Rng & Units 10/18/2022    8:20 AM 08/17/2022    9:26 AM 02/11/2022    2:03 PM  CBC  WBC 4.0 - 10.5 K/uL 7.3  5.4  8.8   Hemoglobin 12.0 - 15.0 g/dL 40.9  81.1  91.4   Hematocrit 36.0 - 46.0 % 35.4  32.8  34.9   Platelets 150 - 400 K/uL 310  394  444        Latest Ref Rng & Units 08/23/2022    8:50 AM 07/14/2021   11:00 AM 03/01/2021    3:51 PM  CMP  Glucose 70 - 99 mg/dL  782  956   BUN 6 - 23 mg/dL  9  8   Creatinine 2.13 - 1.20 mg/dL  0.86  5.78   Sodium 469 - 145 mEq/L  136  133   Potassium 3.5 - 5.1 mEq/L  3.3  3.6   Chloride 96 - 112 mEq/L  103  99   CO2 19 - 32 mEq/L  29  28   Calcium 8.4 - 10.5 mg/dL  8.3  7.9   Total Protein 6.0 - 8.3  g/dL 7.7  7.3  7.0   Total Bilirubin 0.2 - 1.2 mg/dL 0.3  0.3  0.3   Alkaline Phos 39 - 117 U/L 51  67  111  AST 0 - 37 U/L 17  9  13    ALT 0 - 35 U/L 10  6  7      ASSESSMENT & PLAN [name] is a 37 y.o. female who presents for a follow up for iron deficiency anemia.   #Iron deficiency anemia 2/2 ulcerative colitis and menorrhagia: --Last received IV venofer 200 mg x 3 doses from 08/29/2022-09/13/2022 --Patient does not take PO iron therapy since iron is unlikely absorbing much due to her UC.  --Labs from today show anemia has resolved with Hgb of 12.4. Iron panel shows serum iron 93, TIBC 321, saturation 29%, ferritin 101. --No need for additional IV iron at this time. --RTC in 3 months for lab check and 6  months for labs/follow up.  #Ulcerative colitis: --Under the care of [facility], last seen by Dr. Barron Alvine on 08/23/2022.  --Currently on Rinvoq 30 mg daily.   #Bradycardia: --Found to have HR in 50s during IV iron infusion on 08/29/2022. . EKG performed and shows sinus rhythm with premature supraventricular complexes. --Patient reports having a cardiology consultation next month.   No orders of the defined types were placed in this encounter.   All questions were answered. The patient knows to call the clinic with any problems, questions or concerns.  I have spent a total of 30 minutes minutes of face-to-face and non-face-to-face time, preparing to see the patient, performing a medically appropriate examination, counseling and educating the patient, ordering medications/tests/procedures, documenting clinical information in the electronic health record, and care coordination.   Georga Kaufmann, PA-C Department of Hematology/Oncology [facility] Phone: [phone]

## 2022-10-18 NOTE — Telephone Encounter (Signed)
LM for pt with lab results and recommendations.

## 2022-10-18 NOTE — Telephone Encounter (Signed)
-----   Message from Briant Cedar sent at 10/18/2022  2:22 PM EDT ----- Please notify patient that iron levels are normal. No need for additional IV iron at this time.   Plan to see her back in 3 months for labs and 6 months for labs/follow up. ----- Message ----- From: Interface, Lab In [place] Sent: 10/18/2022   8:38 AM EDT To: Briant Cedar, PA-C

## 2022-10-19 ENCOUNTER — Telehealth: Payer: Self-pay | Admitting: Physician Assistant

## 2022-10-27 ENCOUNTER — Telehealth: Payer: Self-pay | Admitting: Gastroenterology

## 2022-10-27 NOTE — Telephone Encounter (Signed)
Patient calls stating that several days ago, she developed itchy eyes, itchy nose and since has developed a dry cough. Has had small amount of congestion and wonders if she may take mucinex while on Rinvoq. Patient denies any fever, chills or body aches, no SOB.   I advised patient that she may take mucinex. I did recommend however that she test for covid as she is somewhat immunosuppressed on Rinvoq and she has vague congestion with dry cough. Patient states that she normally gets these symptoms with allergies. Advised that this could very well be related to seasonal allergies, but would be best rule out covid viral infection. She verbalizes understanding.   Advised if she has worsening symptoms, fever, etc, she contact us to make [place] aware as we may need to make some medication adjustments at that time.

## 2022-10-27 NOTE — Telephone Encounter (Signed)
Inbound call from patient requesting to know if it safe to take Mucinex. States she was diagnosed with ulcerative colitis and also taking a medication that she is concerned it may interfere with. Patient requesting a call back. Please advise, thank you.

## 2022-11-07 ENCOUNTER — Ambulatory Visit: Payer: BLUE CROSS/BLUE SHIELD | Admitting: Internal Medicine

## 2023-01-17 ENCOUNTER — Inpatient Hospital Stay: Payer: BLUE CROSS/BLUE SHIELD | Attending: Physician Assistant

## 2023-04-04 ENCOUNTER — Telehealth (HOSPITAL_COMMUNITY): Payer: Self-pay

## 2023-04-04 ENCOUNTER — Telehealth: Payer: Self-pay

## 2023-04-04 NOTE — Telephone Encounter (Signed)
 Pharmacy Patient Advocate Encounter   Received notification from CoverMyMeds that prior authorization for Rinvoq 45 ER Tablets is required/requested.   Insurance verification completed.   The patient is insured through [facility] [place] .   Per test claim: PA required; PA started via CoverMyMeds. KEY [id number] . Waiting for clinical questions to populate.

## 2023-04-04 NOTE — Telephone Encounter (Signed)
 error

## 2023-04-11 ENCOUNTER — Encounter: Payer: Self-pay | Admitting: Physician Assistant

## 2023-04-18 ENCOUNTER — Inpatient Hospital Stay (HOSPITAL_BASED_OUTPATIENT_CLINIC_OR_DEPARTMENT_OTHER): Payer: BLUE CROSS/BLUE SHIELD | Admitting: Physician Assistant

## 2023-04-18 ENCOUNTER — Inpatient Hospital Stay: Payer: BLUE CROSS/BLUE SHIELD | Attending: Physician Assistant

## 2023-04-18 VITALS — BP 99/61 | HR 46 | Temp 97.6°F | Resp 16 | Ht 59.0 in | Wt 136.7 lb

## 2023-04-18 DIAGNOSIS — N920 Excessive and frequent menstruation with regular cycle: Secondary | ICD-10-CM | POA: Insufficient documentation

## 2023-04-18 DIAGNOSIS — K5190 Ulcerative colitis, unspecified, without complications: Secondary | ICD-10-CM | POA: Diagnosis not present

## 2023-04-18 DIAGNOSIS — D509 Iron deficiency anemia, unspecified: Secondary | ICD-10-CM | POA: Diagnosis not present

## 2023-04-18 LAB — IRON AND IRON BINDING CAPACITY (CC-WL,HP ONLY)
Iron: 68 ug/dL (ref 28–170)
Saturation Ratios: 21 % (ref 10.4–31.8)
TIBC: 323 ug/dL (ref 250–450)
UIBC: 255 ug/dL (ref 148–442)

## 2023-04-18 LAB — CBC WITH DIFFERENTIAL (CANCER CENTER ONLY)
Abs Immature Granulocytes: 0.04 10*3/uL (ref 0.00–0.07)
Basophils Absolute: 0.1 10*3/uL (ref 0.0–0.1)
Basophils Relative: 1 %
Eosinophils Absolute: 0.1 10*3/uL (ref 0.0–0.5)
Eosinophils Relative: 1 %
HCT: 37.5 % (ref 36.0–46.0)
Hemoglobin: 13 g/dL (ref 12.0–15.0)
Immature Granulocytes: 1 %
Lymphocytes Relative: 46 %
Lymphs Abs: 3.3 10*3/uL (ref 0.7–4.0)
MCH: 27.3 pg (ref 26.0–34.0)
MCHC: 34.7 g/dL (ref 30.0–36.0)
MCV: 78.8 fL — ABNORMAL LOW (ref 80.0–100.0)
Monocytes Absolute: 0.5 10*3/uL (ref 0.1–1.0)
Monocytes Relative: 8 %
Neutro Abs: 3 10*3/uL (ref 1.7–7.7)
Neutrophils Relative %: 43 %
Platelet Count: 409 10*3/uL — ABNORMAL HIGH (ref 150–400)
RBC: 4.76 MIL/uL (ref 3.87–5.11)
RDW: 13.1 % (ref 11.5–15.5)
WBC Count: 6.9 10*3/uL (ref 4.0–10.5)
nRBC: 0 % (ref 0.0–0.2)

## 2023-04-18 LAB — FERRITIN: Ferritin: 52 ng/mL (ref 11–307)

## 2023-04-18 NOTE — Progress Notes (Unsigned)
 [facility] Telephone:(336) [phone]   Fax:(336) [phone]  PROGRESS NOTE  Patient Care Team: Pcp, No as PCP - General  CHIEF COMPLAINTS/PURPOSE OF CONSULTATION:  Iron deficiency anemia secondary to ulcerative colitis.   INTERVAL HISTORY:  [name] 38 y.o. female returns for a follow up for iron deficiency anemia. She was last seen on 10/18/2022. In the interim, she denies any changes to her health. She is unaccompanied for this visit.   On exam today, Ms. [name] reports having worsening fatigue. She adds that she works 6 days a week and has two jobs. She recently went on vacation to the [place] and had some gastritis with diarrhea and missed her Rinvoq medication a few days. Her stools are starting to be more solid but she reports some abdominal discomfort. She denies any signs of bleeding except for her monthly menstrual cycles that last 3 days with 1 day of heavy bleeding. She denies fevers, chills, sweats, shortness of breath, chest pain or cough. She has no other complaints. Rest of the ROS is below.   MEDICAL HISTORY:  Past Medical History:  Diagnosis Date   Anemia    Hypokalemia 12/09/2019   Hyponatremia 12/11/2019   Pre-diabetes    Ulcerative colitis (HCC)     SURGICAL HISTORY: Past Surgical History:  Procedure Laterality Date   BIOPSY  12/11/2019   Procedure: BIOPSY;  Surgeon: Kathi Der, MD;  Location: [facility];  Service: Gastroenterology;;   COLONOSCOPY N/A 12/11/2019   Procedure: COLONOSCOPY;  Surgeon: Kathi Der, MD;  Location: [facility];  Service: Gastroenterology;  Laterality: N/A;   COLONOSCOPY     NO PAST SURGERIES      SOCIAL HISTORY: Social History   Socioeconomic History   Marital status: Single    Spouse name: Not on file   Number of children: Not on file   Years of education: Not on file   Highest education level: Not on file  Occupational History   Not on file  Tobacco Use   Smoking status: Never    Smokeless tobacco: Never  Vaping Use   Vaping status: Never Used  Substance and Sexual Activity   Alcohol use: Yes    Comment: occ   Drug use: Yes    Frequency: 7.0 times per week    Types: Marijuana   Sexual activity: Yes    Birth control/protection: Injection  Other Topics Concern   Not on file  Social History Narrative   Not on file   Social Drivers of Health   Financial Resource Strain: Not on file  Food Insecurity: Not on file  Transportation Needs: Not on file  Physical Activity: Not on file  Stress: Not on file  Social Connections: Not on file  Intimate Partner Violence: Not on file    FAMILY HISTORY: Family History  Problem Relation Age of Onset   Hypertension Father    Diabetes Father    Stomach cancer Maternal Aunt    Diabetes Maternal Grandmother    Heart disease Maternal Grandfather    Diabetes Paternal Grandfather    Colon cancer Neg Hx    Esophageal cancer Neg Hx    Pancreatic cancer Neg Hx     ALLERGIES:  has no known allergies.  MEDICATIONS:  Current Outpatient Medications  Medication Sig Dispense Refill   Upadacitinib ER (RINVOQ) 30 MG TB24 Take 1 tablet (30 mg total) by mouth daily. MAINTENANCE DOSE 30 tablet 11   ondansetron (ZOFRAN ODT) 4 MG disintegrating tablet Take 1  tablet (4 mg total) by mouth every 8 (eight) hours as needed for nausea or vomiting. (Patient not taking: Reported on 04/18/2023) 30 tablet 0   No current facility-administered medications for this visit.    REVIEW OF SYSTEMS:   Constitutional: ( - ) fevers, ( - )  chills , ( - ) night sweats Eyes: ( - ) blurriness of vision, ( - ) double vision, ( - ) watery eyes Ears, nose, mouth, throat, and face: ( - ) mucositis, ( - ) sore throat Respiratory: ( - ) cough, ( - ) dyspnea, ( - ) wheezes Cardiovascular: ( - ) palpitation, ( - ) chest discomfort, ( - ) lower extremity swelling Gastrointestinal:  ( - ) nausea, ( - ) heartburn, ( - ) change in bowel habits Skin: ( - )  abnormal skin rashes Lymphatics: ( - ) new lymphadenopathy, ( - ) easy bruising Neurological: ( - ) numbness, ( - ) tingling, ( - ) new weaknesses Behavioral/Psych: ( - ) mood change, ( - ) new changes  All other systems were reviewed with the patient and are negative.  PHYSICAL EXAMINATION: ECOG PERFORMANCE STATUS: 1 - Symptomatic but completely ambulatory  Vitals:   04/18/23 0840  BP: 99/61  Pulse: (!) 46  Resp: 16  Temp: 97.6 F (36.4 C)  SpO2: 100%   Filed Weights   04/18/23 0840  Weight: 136 lb 11.2 oz (62 kg)    GENERAL: well appearing female in NAD  SKIN: skin color, texture, turgor are normal, no rashes or significant lesions EYES: conjunctiva are pink and non-injected, sclera clear LUNGS: clear to auscultation and percussion with normal breathing effort HEART: bradycardic with regular rhythm and no murmurs and no lower extremity edema Musculoskeletal: no cyanosis of digits and no clubbing  PSYCH: alert & oriented x 3, fluent speech NEURO: no focal motor/sensory deficits  LABORATORY DATA:  I have reviewed the data as listed    Latest Ref Rng & Units 04/18/2023    8:21 AM 10/18/2022    8:20 AM 08/17/2022    9:26 AM  CBC  WBC 4.0 - 10.5 K/uL 6.9  7.3  5.4   Hemoglobin 12.0 - 15.0 g/dL 40.9  81.1  91.4   Hematocrit 36.0 - 46.0 % 37.5  35.4  32.8   Platelets 150 - 400 K/uL 409  310  394        Latest Ref Rng & Units 08/23/2022    8:50 AM 07/14/2021   11:00 AM 03/01/2021    3:51 PM  CMP  Glucose 70 - 99 mg/dL  782  956   BUN 6 - 23 mg/dL  9  8   Creatinine 2.13 - 1.20 mg/dL  0.86  5.78   Sodium 469 - 145 mEq/L  136  133   Potassium 3.5 - 5.1 mEq/L  3.3  3.6   Chloride 96 - 112 mEq/L  103  99   CO2 19 - 32 mEq/L  29  28   Calcium 8.4 - 10.5 mg/dL  8.3  7.9   Total Protein 6.0 - 8.3 g/dL 7.7  7.3  7.0   Total Bilirubin 0.2 - 1.2 mg/dL 0.3  0.3  0.3   Alkaline Phos 39 - 117 U/L 51  67  111   AST 0 - 37 U/L 17  9  13    ALT 0 - 35 U/L 10  6  7      ASSESSMENT  & PLAN [name] is a  38 y.o. female who presents for a follow up for iron deficiency anemia.   #Iron deficiency anemia 2/2 ulcerative colitis and menorrhagia: --Last received IV venofer 200 mg x 3 doses from 08/29/2022-09/13/2022 --Patient does not take PO iron therapy since iron is unlikely absorbing much due to her UC.  --Labs from today shows no evidence of anemia with Hgb 13.0. Iron panel shows iron 68, TIBC 323, saturation 21%, ferritin 52 --No need for additional IV iron at this time. --RTC in 3 months for lab check and 6  months for labs/follow up.  #Ulcerative colitis: --Under the care of [facility], last seen by Dr. Barron Alvine on 08/23/2022.  --Currently on Rinvoq 30 mg daily.   #Bradycardia: --Found to have HR in 50s during IV iron infusion on 08/29/2022. . EKG performed and shows sinus rhythm with premature supraventricular complexes. --HR is 46 today.  --Scheduled for cardiology consultation this week.   No orders of the defined types were placed in this encounter.   All questions were answered. The patient knows to call the clinic with any problems, questions or concerns.  I have spent a total of 25 minutes minutes of face-to-face and non-face-to-face time, preparing to see the patient, performing a medically appropriate examination, counseling and educating the patient, ordering medications/tests/procedures, documenting clinical information in the electronic health record, and care coordination.   Georga Kaufmann, PA-C Department of Hematology/Oncology [facility] Phone: [phone]

## 2023-04-19 ENCOUNTER — Encounter: Payer: Self-pay | Admitting: Physician Assistant

## 2023-04-19 NOTE — Progress Notes (Unsigned)
 Cardiology Office Note:  .   Date:  04/20/2023  ID:  [name], DOB 11/13/1985, MRN [id number] PCP: Pcp, No  [facility] Providers Cardiologist:  None    History of Present Illness: .    Chief Complaint  Patient presents with   sinus bradycardia    [name] is a 38 y.o. female with history of IDA who presents for the evaluation of bradycardia at the request of Shanon Ace,*. Noted to have sinus bradycardia in June with iron infusion, no symptoms.   History of Present Illness   [name] is a 37 year old female who presents with bradycardia. She was referred for evaluation after bradycardia was noted during an iron infusion in June.  In June, during an iron infusion, she was noted to have bradycardia but was asymptomatic at that time. An EKG performed today shows sinus rhythm with a heart rate of 73 and sinus arrhythmia. She denies any history of hypertension, diabetes, myocardial infarction, or cerebrovascular accident. She experiences occasional dyspnea, which she associates with weight gain. Her current weight is 136 pounds, higher than her usual weight of around 110 pounds.  She has a history of iron deficiency anemia for which she receives iron infusions. There is no indication of current issues related to anemia during this visit.  She has ulcerative colitis and is currently on Rinvoq, having previously been on Remicade. She does not take Zofran anymore, as she has not needed it for months.  Her family history includes her mother seeing a cardiologist, though the specific condition is unknown. Socially, she smokes marijuana, drinks alcohol occasionally, works as a Production assistant, radio, and is single with 1 child. She experiences dizziness and lightheadedness, which she attributes to using scented plug-ins or prolonged phone use. No issues with daily activities.       HGB 13.0     Problem List IDA Ulcerative colitis     ROS: All other ROS reviewed and  negative. Pertinent positives noted in the HPI.     Studies Reviewed: Marland Kitchen   EKG Interpretation Date/Time:  Thursday April 20 2023 10:04:07 EDT Ventricular Rate:  73 PR Interval:  146 QRS Duration:  78 QT Interval:  372 QTC Calculation: 409 R Axis:   61  Text Interpretation: Normal sinus rhythm with sinus arrhythmia Normal ECG Confirmed by Lennie Odor [id number]) on 04/20/2023 10:10:27 AM   Physical Exam:   VS:  BP 92/60 (BP Location: Left Arm, Patient Position: Sitting, Cuff Size: Normal)   Pulse 73   Ht 4\' 11"  (1.499 m)   Wt 137 lb (62.1 kg)   SpO2 100%   BMI 27.67 kg/m    Wt Readings from Last 3 Encounters:  04/20/23 137 lb (62.1 kg)  04/18/23 136 lb 11.2 oz (62 kg)  10/18/22 130 lb 12.8 oz (59.3 kg)    GEN: Well nourished, well developed in no acute distress NECK: No JVD; No carotid bruits CARDIAC: RRR, no murmurs, rubs, gallops RESPIRATORY:  Clear to auscultation without rales, wheezing or rhonchi  ABDOMEN: Soft, non-tender, non-distended EXTREMITIES:  No edema; No deformity  ASSESSMENT AND PLAN: .   Assessment and Plan    Bradycardia, sinus Bradycardia during iron infusion in June, asymptomatic. Current EKG normal sinus rhythm, HR 73, sinus arrhythmia normal. No conduction disease symptoms. Likely due to young, healthy status. No further evaluation needed. - Order TSH to assess thyroid function. - no further testing needed   Iron deficiency anemia Iron deficiency anemia requiring  iron infusions. No current anemia symptoms. - Continue iron infusions as needed.  Follow-up Currently seeking new primary care physician. - Advise to follow up with primary care physician once established.              Follow-up: Return if symptoms worsen or fail to improve.   Signed, Lenna Gilford. Flora Lipps, MD, [facility]  [facility]  [address], [address] [place], [place] [address] [phone]  10:21 AM

## 2023-04-20 ENCOUNTER — Encounter: Payer: Self-pay | Admitting: Cardiovascular Disease

## 2023-04-20 ENCOUNTER — Ambulatory Visit: Attending: Cardiovascular Disease | Admitting: Cardiovascular Disease

## 2023-04-20 VITALS — BP 92/60 | HR 73 | Ht 59.0 in | Wt 137.0 lb

## 2023-04-20 DIAGNOSIS — R001 Bradycardia, unspecified: Secondary | ICD-10-CM

## 2023-04-20 LAB — TSH: TSH: 2.14 u[IU]/mL (ref 0.450–4.500)

## 2023-04-20 NOTE — Patient Instructions (Signed)
 Medication Instructions:  Your physician recommends that you continue on your current medications as directed. Please refer to the Current Medication list given to you today.    *If you need a refill on your cardiac medications before your next appointment, please call your pharmacy*   Lab Work: None    If you have labs (blood work) drawn today and your tests are completely normal, you will receive your results only by: MyChart Message (if you have MyChart) OR A paper copy in the mail If you have any lab test that is abnormal or we need to change your treatment, we will call you to review the results.   Testing/Procedures: None    Follow-Up: At [facility], you and your health needs are our priority.  As part of our continuing mission to provide you with exceptional heart care, we have created designated Provider Care Teams.  These Care Teams include your primary Cardiologist (physician) and Advanced Practice Providers (APPs -  Physician Assistants and Nurse Practitioners) who all work together to provide you with the care you need, when you need it.  We recommend signing up for the patient portal called "MyChart".  Sign up information is provided on this After Visit Summary.  MyChart is used to connect with patients for Virtual Visits (Telemedicine).  Patients are able to view lab/test results, encounter notes, upcoming appointments, etc.  Non-urgent messages can be sent to your provider as well.   To learn more about what you can do with MyChart, go to [email or web address].    Your next appointment:   Follow up as needed    Provider:   Lennie Odor, MD   Other Instructions

## 2023-04-21 ENCOUNTER — Encounter: Payer: Self-pay | Admitting: Cardiovascular Disease

## 2023-05-01 ENCOUNTER — Emergency Department (HOSPITAL_COMMUNITY)
Admission: EM | Admit: 2023-05-01 | Discharge: 2023-05-01 | Disposition: A | Discharge status: Home / Self Care | Attending: Emergency Medicine | Admitting: Emergency Medicine

## 2023-05-01 ENCOUNTER — Encounter (HOSPITAL_COMMUNITY): Payer: Self-pay

## 2023-05-01 DIAGNOSIS — K0889 Other specified disorders of teeth and supporting structures: Secondary | ICD-10-CM | POA: Insufficient documentation

## 2023-05-01 DIAGNOSIS — Z794 Long term (current) use of insulin: Secondary | ICD-10-CM | POA: Insufficient documentation

## 2023-05-01 MED ORDER — AMOXICILLIN-POT CLAVULANATE 875-125 MG PO TABS
1.0000 | ORAL_TABLET | Freq: Once | ORAL | Status: AC
  Administered 2023-05-01: 1 via ORAL
  Filled 2023-05-01: qty 1

## 2023-05-01 MED ORDER — AMOXICILLIN-POT CLAVULANATE 875-125 MG PO TABS: 1.0000 | ORAL_TABLET | Freq: Two times a day (BID) | ORAL | 0 refills | Status: DC

## 2023-05-01 MED ORDER — OXYCODONE-ACETAMINOPHEN 5-325 MG PO TABS
1.0000 | ORAL_TABLET | Freq: Once | ORAL | Status: AC
  Administered 2023-05-01: 1 via ORAL
  Filled 2023-05-01: qty 1

## 2023-05-01 NOTE — ED Triage Notes (Addendum)
 Pt presents with dental pain and swelling to the bottom R side x 1 week. Pt took Tylenol PM last night to help sleep.

## 2023-05-01 NOTE — Discharge Instructions (Addendum)
 You were seen in the ER today for evaluation of your dental pain. You do have multiple dental caries and decay that you will ultimately need to follow-up with a dentist about.  I included information for an on-call dentist here as well as dental resources.  Please make sure you call to [facility] availability and when discussed on appointment.  For pain you can take 1000 g of Tylenol every 6 hours as needed.  You were given narcotic pain medication and your first dose of the antibiotic while here.  You need to pick up the rest of your antibiotics at the pharmacy.  If you have any worsening pain, swelling, trouble opening her mouth, fever, please return to your nearest emerged department for reevaluation.  If you have any other concerns, new or worsening symptoms, please return your nearest emerged apartment for reevaluation.  Contact a dental care provider if: You have dental pain and you don't know why. Your pain isn't controlled with medicines. Your symptoms get worse. You have new symptoms. Get help right away if: You can't open your mouth. You're having trouble breathing or swallowing. You have a fever. Your face, neck, or jaw is swollen. These symptoms may be an emergency. Call 911 right away. Do not wait to see if the symptoms will go away. Do not drive yourself to the hospital.

## 2023-05-01 NOTE — ED Provider Notes (Signed)
 [place] EMERGENCY DEPARTMENT AT [facility] Provider Note   CSN: [id number] Arrival date & time: 05/01/23  1127     History Chief Complaint  Patient presents with   Dental Pain    [name] is a 38 y.o. female with history of ulcerative colitis presents to the emergency department today for evaluation of right-sided dental pain for the past few days.  She reports that she does have bad teeth.  Cannot remember the last time she went to a dentist.  Denies any fever.  She reports the pains been worsening.  Has felt better with ice pack to the area.  Reports that she can only take Tylenol due to her ulcerative colitis.  She denies any trouble swallowing or trouble breathing.  Denies any chest pain or shortness of breath.  Denies any neck swelling.  Denies any fevers.   Dental Pain Associated symptoms: no facial swelling, no fever and no neck pain        Home Medications Prior to Admission medications   Medication Sig Start Date End Date Taking? Authorizing Provider  Upadacitinib ER (RINVOQ) 30 MG TB24 Take 1 tablet (30 mg total) by mouth daily. MAINTENANCE DOSE 05/11/22   Cirigliano, Vito V, DO  insulin glargine (LANTUS) 100 UNIT/ML injection Inject 0.1 mLs (10 Units total) into the skin at bedtime. 12/13/19 12/18/19  Eliezer Bottom, MD      Allergies    Nsaids    Review of Systems   Review of Systems  Constitutional:  Negative for chills and fever.  HENT:  Positive for dental problem. Negative for facial swelling and trouble swallowing.   Respiratory:  Negative for shortness of breath.   Cardiovascular:  Negative for chest pain.  Musculoskeletal:  Negative for neck pain and neck stiffness.    Physical Exam Updated Vital Signs BP 109/79 (BP Location: Right Arm)   Pulse 62   Temp 97.9 F (36.6 C) (Oral)   Resp 16   Ht 4\' 11"  (1.499 m)   Wt 61.7 kg   LMP 04/01/2023   SpO2 98%   Breastfeeding No   BMI 27.47 kg/m  Physical Exam Vitals and nursing note  reviewed.  Constitutional:      General: She is not in acute distress.    Appearance: She is not ill-appearing or toxic-appearing.  HENT:     Right Ear: Tympanic membrane, ear canal and external ear normal.     Left Ear: Tympanic membrane, ear canal and external ear normal.     Mouth/Throat:     Mouth: Mucous membranes are moist.     Comments: Poor dentition noted throughout. She does have some degree of mild local swelling with no overlying erythema or warmth to the right lower dentition. She has no trismus. There is no palpable fluctuance, does have some induration between the buccal mucosa and gumline.  There is no drainable abscess seen or palpated.  No sublingual elevation.  Controlling secretions.  Normal speech.  Uvula midline.  Airway patent. Eyes:     General: No scleral icterus. Neck:     Comments: No swelling, LAD, or overlying erythema/increase in warmth.  Cardiovascular:     Rate and Rhythm: Normal rate.  Pulmonary:     Effort: Pulmonary effort is normal. No respiratory distress.     Breath sounds: Normal breath sounds. No stridor.  Musculoskeletal:     Cervical back: Normal range of motion. No rigidity or tenderness.  Lymphadenopathy:     Cervical: No  cervical adenopathy.  Skin:    General: Skin is warm and dry.  Neurological:     Mental Status: She is alert.     ED Results / Procedures / Treatments   Labs (all labs ordered are listed, but only abnormal results are displayed) Labs Reviewed - No data to display  EKG None  Radiology No results found.  Procedures Procedures   Medications Ordered in ED Medications - No data to display  ED Course/ Medical Decision Making/ A&P                               Medical Decision Making Risk Prescription drug management.   38 y.o. female presents to the ER for evaluation of dental pain. Differential diagnosis includes but is not limited to dental pain, dental caries, abscess, ludwig's angina. Vital signs  unremarkable. Physical exam as noted above.   She was given a dose of narcotics here as her sister drove her. First dose of antibiotics given here.  She does have some degree of mild local swelling with no overlying erythema or warmth.  She has no trismus.  She does have some poor dentition throughout mainly in the back molars.  There is no palpable fluctuance, does have some induration between the buccal mucosa and gumline.  There is no drainable abscess seen.  No sublingual elevation.  Controlling secretions.  Normal speech.  Uvula midline.  Airway patent.  No stridor.  I doubt any deep space infection.  There is no swelling to the neck or cervical lymphadenopathy.  Her vital signs are stable.  She is stable for discharge home with close outpatient follow-up with dentistry and antibiotics.  Dentist resources given her.  We discussed plan at bedside. We discussed strict return precautions and red flag symptoms. The patient verbalized their understanding and agrees to the plan. The patient is stable and being discharged home in good condition.  Portions of this report may have been transcribed using voice recognition software. Every effort was made to ensure accuracy; however, inadvertent computerized transcription errors may be present.    Final Clinical Impression(s) / ED Diagnoses Final diagnoses:  Pain, dental    Rx / DC Orders ED Discharge Orders          Ordered    amoxicillin-clavulanate (AUGMENTIN) 875-125 MG tablet  Every 12 hours        05/01/23 1357              Achille Rich, PA-C 05/02/23 2348    Estelle June A, DO 05/08/23 1153

## 2023-05-09 ENCOUNTER — Encounter: Payer: Self-pay | Admitting: Gastroenterology

## 2023-05-23 ENCOUNTER — Encounter: Payer: Self-pay | Admitting: Physician Assistant

## 2023-05-24 ENCOUNTER — Other Ambulatory Visit: Payer: Self-pay | Admitting: Gastroenterology

## 2023-06-02 ENCOUNTER — Other Ambulatory Visit: Payer: Self-pay

## 2023-06-02 ENCOUNTER — Emergency Department (HOSPITAL_COMMUNITY)
Admission: EM | Admit: 2023-06-02 | Discharge: 2023-06-02 | Disposition: A | Discharge status: Home / Self Care | Attending: Student | Admitting: Student

## 2023-06-02 ENCOUNTER — Emergency Department (HOSPITAL_COMMUNITY)

## 2023-06-02 DIAGNOSIS — K0889 Other specified disorders of teeth and supporting structures: Secondary | ICD-10-CM | POA: Insufficient documentation

## 2023-06-02 DIAGNOSIS — D72829 Elevated white blood cell count, unspecified: Secondary | ICD-10-CM | POA: Diagnosis not present

## 2023-06-02 DIAGNOSIS — R6884 Jaw pain: Secondary | ICD-10-CM | POA: Diagnosis present

## 2023-06-02 LAB — CBC WITH DIFFERENTIAL/PLATELET
Abs Immature Granulocytes: 0.04 10*3/uL (ref 0.00–0.07)
Basophils Absolute: 0.1 10*3/uL (ref 0.0–0.1)
Basophils Relative: 0 %
Eosinophils Absolute: 0 10*3/uL (ref 0.0–0.5)
Eosinophils Relative: 0 %
HCT: 37.1 % (ref 36.0–46.0)
Hemoglobin: 13.1 g/dL (ref 12.0–15.0)
Immature Granulocytes: 0 %
Lymphocytes Relative: 12 %
Lymphs Abs: 1.4 10*3/uL (ref 0.7–4.0)
MCH: 27.5 pg (ref 26.0–34.0)
MCHC: 35.3 g/dL (ref 30.0–36.0)
MCV: 77.9 fL — ABNORMAL LOW (ref 80.0–100.0)
Monocytes Absolute: 0.7 10*3/uL (ref 0.1–1.0)
Monocytes Relative: 6 %
Neutro Abs: 9 10*3/uL — ABNORMAL HIGH (ref 1.7–7.7)
Neutrophils Relative %: 82 %
Platelets: 332 10*3/uL (ref 150–400)
RBC: 4.76 MIL/uL (ref 3.87–5.11)
RDW: 13.6 % (ref 11.5–15.5)
WBC: 11.2 10*3/uL — ABNORMAL HIGH (ref 4.0–10.5)
nRBC: 0 % (ref 0.0–0.2)

## 2023-06-02 LAB — BASIC METABOLIC PANEL WITH GFR
Anion gap: 8 (ref 5–15)
BUN: 14 mg/dL (ref 6–20)
CO2: 24 mmol/L (ref 22–32)
Calcium: 8.6 mg/dL — ABNORMAL LOW (ref 8.9–10.3)
Chloride: 105 mmol/L (ref 98–111)
Creatinine, Ser: 0.71 mg/dL (ref 0.44–1.00)
GFR, Estimated: >60 mL/min (ref >60)
Glucose, Bld: 94 mg/dL (ref 70–99)
Potassium: 4.2 mmol/L (ref 3.5–5.1)
Sodium: 137 mmol/L (ref 135–145)

## 2023-06-02 LAB — HCG, SERUM, QUALITATIVE: Preg, Serum: NEGATIVE

## 2023-06-02 MED ORDER — IOHEXOL 350 MG/ML SOLN
75.0000 mL | Freq: Once | INTRAVENOUS | Status: AC | PRN
  Administered 2023-06-02: 75 mL via INTRAVENOUS

## 2023-06-02 MED ORDER — ACETAMINOPHEN 500 MG PO TABS
1000.0000 mg | ORAL_TABLET | Freq: Once | ORAL | Status: AC
  Administered 2023-06-02: 1000 mg via ORAL
  Filled 2023-06-02: qty 2

## 2023-06-02 MED ORDER — AMOXICILLIN-POT CLAVULANATE 875-125 MG PO TABS
1.0000 | ORAL_TABLET | Freq: Once | ORAL | Status: AC
  Administered 2023-06-02: 1 via ORAL
  Filled 2023-06-02: qty 1

## 2023-06-02 MED ORDER — AMOXICILLIN-POT CLAVULANATE 875-125 MG PO TABS: 1.0000 | ORAL_TABLET | Freq: Two times a day (BID) | ORAL | 0 refills | Status: DC

## 2023-06-02 NOTE — ED Notes (Signed)
 Patient transported to CT

## 2023-06-02 NOTE — ED Triage Notes (Signed)
 Patient continues to have pain and swelling to R jaw. Seen for same on 3/31 and was prescribed abx, which she did not complete d/t leaving her pills at home when she went on a trip. Swelling worse since Sunday.

## 2023-06-02 NOTE — ED Provider Notes (Cosign Needed Addendum)
  EMERGENCY DEPARTMENT AT [facility] Provider Note   CSN: [id number] Arrival date & time: 06/02/23  5427     History  Chief Complaint  Patient presents with   Jaw Pain    [name] is a 38 y.o. female history of ulcerative pancolitis, iron  deficiency anemia presenting with right facial swelling.  Patient was seen on 331 and given Augmentin  and pain medicine however only took 3 days of the 7-day course that she went to [place] .  Patient states that the swelling came back 5 days ago and is getting worse.  Patient denies any fevers, neck pain, purulent material or trouble swallowing but states that her right jaw hurts from all the swelling.  Patient denies headache or vision changes.  Patient states she has not seen a dentist and is waiting for insurance to approve 1.    Home Medications Prior to Admission medications   Medication Sig Start Date End Date Taking? Authorizing Provider  amoxicillin -clavulanate (AUGMENTIN ) 875-125 MG tablet Take 1 tablet by mouth every 12 (twelve) hours. 06/02/23  Yes Liberty Stead, Arlin Benes, PA-C  RINVOQ  30 MG TB24 TAKE 1 TABLET (30 MG TOTAL)  BY MOUTH ONCE DAILY. MAINTENANCE DOSE. 05/24/23   Cirigliano, Vito V, DO  insulin  glargine (LANTUS ) 100 UNIT/ML injection Inject 0.1 mLs (10 Units total) into the skin at bedtime. 12/13/19 12/18/19  Aslam, Sadia, MD      Allergies    Nsaids    Review of Systems   Review of Systems  Physical Exam Updated Vital Signs BP 118/64 (BP Location: Right Arm)   Pulse 71   Temp 98.2 F (36.8 C)   Resp 17   SpO2 98%  Physical Exam Constitutional:      General: She is not in acute distress. HENT:     Head: Normocephalic and atraumatic.     Jaw: There is normal jaw occlusion.     Salivary Glands: Right salivary gland is not diffusely enlarged or tender. Left salivary gland is not diffusely enlarged or tender.     Comments: No facial swelling    Right Ear: Hearing normal.     Left Ear: Hearing  normal.     Nose: Nose normal.     Mouth/Throat:     Lips: Pink.     Mouth: Mucous membranes are moist.     Pharynx: Oropharynx is clear. Uvula midline.     Comments: No oral floor swelling Tolerating secretions No purulent drainage no No PTA noted No muffled voice noted Right-sided facial swelling that is tender to palpation in the right cheek area Fractured tooth noted in the inferior right molar however no signs of purulent material or gingival changes Teeth appear stable and palpated Eyes:     Extraocular Movements: Extraocular movements intact.     Conjunctiva/sclera: Conjunctivae normal.     Pupils: Pupils are equal, round, and reactive to light.  Neck:     Comments: No neck swelling Cardiovascular:     Rate and Rhythm: Normal rate and regular rhythm.     Pulses: Normal pulses.     Heart sounds: Normal heart sounds.  Pulmonary:     Effort: Pulmonary effort is normal. No respiratory distress.     Breath sounds: Normal breath sounds.     Comments: No stridor Musculoskeletal:     Cervical back: Normal range of motion and neck supple. No rigidity or tenderness.  Neurological:     Mental Status: She is alert.  ED Results / Procedures / Treatments   Labs (all labs ordered are listed, but only abnormal results are displayed) Labs Reviewed  BASIC METABOLIC PANEL WITH GFR - Abnormal; Notable for the following components:      Result Value   Calcium 8.6 (*)    All other components within normal limits  CBC WITH DIFFERENTIAL/PLATELET - Abnormal; Notable for the following components:   WBC 11.2 (*)    MCV 77.9 (*)    Neutro Abs 9.0 (*)    All other components within normal limits  HCG, SERUM, QUALITATIVE    EKG None  Radiology CT Soft Tissue Neck W Contrast Result Date: 06/02/2023 CLINICAL DATA:  38 year old female with right facial swelling and dental cavities. EXAM: CT NECK WITH CONTRAST TECHNIQUE: Multidetector CT imaging of the neck was performed using the  standard protocol following the bolus administration of intravenous contrast. RADIATION DOSE REDUCTION: This exam was performed according to the departmental dose-optimization program which includes automated exposure control, adjustment of the mA and/or kV according to patient size and/or use of iterative reconstruction technique. CONTRAST:  75mL OMNIPAQUE  IOHEXOL  350 MG/ML SOLN COMPARISON:  None Available. FINDINGS: Pharynx and larynx: Glottis is closed. Larynx and pharynx contours, enhancement within normal limits. Negative parapharyngeal and retropharyngeal spaces. Salivary glands: Negative sublingual space, sublingual glands. Submandibular glands remain within normal limits. There is inflammation along the lateral and inferior margin of the right submandibular space, but the spaces self is relatively spared. Adjacent thickening of the platysma, see additional findings below. Parotid spaces are normal. Thyroid: Subcentimeter bilateral thyroid hypodense nodules. Not clinically significant; no follow-up imaging recommended (ref: J Am Coll Radiol. 2015 Feb;12(2): 143-50). Lymph nodes: Asymmetric soft tissue thickening and inflammation along the posterior right buccal space and body of the right mandible (series 5, image 31). Underlying carious dentition. And the epicenter of the inflammation appears adjacent to a small area of lateral cortical dehiscence associated with periapical lucency of the carious right anterior mandible molar. See series 8, image 44. Mostly eroded right posterior mandible molar. No discrete subperiosteal abscess. No soft tissue gas. Small but reactive level 1 lymph nodes. And right level IIIb lymph node also appears mildly enlarged and hyperenhancing on series 4, image 62. Other bilateral lymph node stations are negative. Vascular: Major vascular structures in the neck and at the skull base are enhancing and patent, normal. Limited intracranial: Negative. Visualized orbits: Negative.  Mastoids and visualized paranasal sinuses: Well aerated bilaterally. Tympanic cavities and mastoids are clear. Skeleton: Additional bilateral dental caries, especially left maxillary anterior molar, right maxillary posterior molar. Other visible osseous structures appear negative. Upper chest: 4 vessel aortic arch, the left vertebral artery arises directly from the arch which is a normal variant. Otherwise negative. IMPRESSION: 1. Odontogenic Right facial Cellulitis, with epicenter of inflammation adjacent to lateral cortical dehiscence of carious right anterior mandible molar. No abscess. Reactive right level 1 and 3 lymph nodes. 2. Other carious dentition. Electronically Signed   By: Marlise Simpers M.D.   On: 06/02/2023 11:08    Procedures Procedures    Medications Ordered in ED Medications  amoxicillin -clavulanate (AUGMENTIN ) 875-125 MG per tablet 1 tablet (has no administration in time range)  acetaminophen  (TYLENOL ) tablet 1,000 mg (1,000 mg Oral Given 06/02/23 0934)  iohexol  (OMNIPAQUE ) 350 MG/ML injection 75 mL (75 mLs Intravenous Contrast Given 06/02/23 1059)    ED Course/ Medical Decision Making/ A&P  Medical Decision Making Amount and/or Complexity of Data Reviewed Labs: ordered. Radiology: ordered.  Risk OTC drugs. Prescription drug management.   [employer] 38 y.o. presented today for dental pain. Working DDx that I considered at this time includes, but not limited to, reversible vs irreversible pulpitis, ludwig's angina, OM, dental abscess, PTA, RPA, Orofacial space infx, peripharyngeal space infx, dental caries, necrotizing gingivitis, gingivitis, peritonitis, parapharyngeal infx.  R/o DDx: ludwig's angina, OM, dental abscess, PTA, RPA, Orofacial space infx, peripharyngeal space infx, necrotizing gingivitis, gingivitis, peritonitis, parapharyngeal infx: These are considered less likely due to history of present illness and physical exam  findings  Review of prior external notes: None  Unique Tests and My Independent Interpretation:  CBC: Mild leukocytosis 11.2 BMP: Unremarkable hCG serum positive: Negative CT soft tissue neck with contrast: Some cellulitis around fractured molar  Social Determinants of Health: none  Discussion with Independent Historian: None  Discussion of Management of Tests: None  Risk: Medium: prescription drug management  Risk Stratification Score: None  Plan: On exam patient was no acute distress with stable vitals.  On exam patient does have right-sided facial swelling in the cheek area along with a fractured tooth in the molar region in the inferior portion right side.  I do not appreciate any material or gingival changes or signs of infection or life-threatening pathology.  Patient states she only took 3 days of the 7-day course of antibiotics and the swelling has come back and is worse.  After discussion with the patient we will get labs and imaging give Tylenol  as patient did not like how the Percocet made her feel when she was prescribed this.  CT scan shows some cellulitis around the fractured tooth however no deep space infection.  Patient tolerate secretions and is ultimately nontoxic-appearing.  At this time do feel patient is safe to be discharged as patient states she would like to go home which is reasonable.  Will give first dose of Augmentin  here and I strongly encouraged that the patient take the Augmentin  as prescribed and all the way through to avoid future complications.  Discussed using Tylenol  every 6 hours as needed for pain.  Patient educated on importance of following up with PCP and dentist for definitive management.  Patient was given return precautions. Patient stable for discharge at this time.  Patient verbalized understanding of plan.  This chart was dictated using voice recognition software.  Despite best efforts to proofread,  errors can occur which can change the  documentation meaning.        Final Clinical Impression(s) / ED Diagnoses Final diagnoses:  Pain, dental    Rx / DC Orders ED Discharge Orders          Ordered    amoxicillin -clavulanate (AUGMENTIN ) 875-125 MG tablet  Every 12 hours        06/02/23 1112              Denese Finn, PA-C 06/02/23 1117    Denese Finn, PA-C 06/02/23 1152    Kommor, Alyse July, MD 06/03/23 1318

## 2023-06-02 NOTE — Discharge Instructions (Addendum)
 Please follow-up with your primary care provider and dentist in regards to recent symptoms and ER visit. You have been seen in the Emergency Department (ED) today for dental pain.  Fortunately your workup and exam was reassuring and it appears that you have cellulitis from a broken tooth that he will need to see a dentist for.  Please take your prescribed antibiotic. You should also take over-the-counter pain medication such as Tylenol  according to the label instructions.  Please see your dentist as soon as possible; only a dentist will be able to fix your problem(s).  Return to the ED if you develop worsening pain, fever, pus/drainage, difficulty breathing, or other symptoms that concern you.

## 2023-06-30 ENCOUNTER — Other Ambulatory Visit (HOSPITAL_COMMUNITY): Payer: Self-pay

## 2023-07-17 ENCOUNTER — Inpatient Hospital Stay: Attending: Physician Assistant

## 2023-08-01 ENCOUNTER — Telehealth: Payer: Self-pay

## 2023-08-01 ENCOUNTER — Other Ambulatory Visit (HOSPITAL_COMMUNITY): Payer: Self-pay

## 2023-08-01 NOTE — Telephone Encounter (Signed)
 Pharmacy Patient Advocate Encounter   Received notification from CoverMyMeds that prior authorization for Rinvoq  30MG  er tablets is required/requested.   Insurance verification completed.   The patient is insured through [facility] [place] .   Per test claim: PA required; PA submitted to above mentioned insurance via CoverMyMeds Key/confirmation #/EOC [id number] Status is pending

## 2023-08-02 NOTE — Telephone Encounter (Signed)
 Pharmacy Patient Advocate Encounter  Received notification from [facility] Medicaid that Prior Authorization for Rinvoq  30MG  er tablets has been APPROVED from 08-01-2023 to 07-31-2024   PA #/Case ID/Reference #: [id number]

## 2023-08-18 ENCOUNTER — Telehealth: Payer: Self-pay | Admitting: Physician Assistant

## 2023-08-18 NOTE — Telephone Encounter (Signed)
 Called to see if the patient wants to reschedule the labs that she canceled back in June. Left VM for the patient to call back if she would like to reschedule those lab appointments.

## 2023-10-16 ENCOUNTER — Other Ambulatory Visit: Payer: Self-pay | Admitting: Physician Assistant

## 2023-10-16 DIAGNOSIS — D509 Iron deficiency anemia, unspecified: Secondary | ICD-10-CM

## 2023-10-17 ENCOUNTER — Inpatient Hospital Stay: Attending: Physician Assistant

## 2023-10-17 ENCOUNTER — Ambulatory Visit: Payer: Self-pay | Admitting: Physician Assistant

## 2023-10-17 ENCOUNTER — Inpatient Hospital Stay (HOSPITAL_BASED_OUTPATIENT_CLINIC_OR_DEPARTMENT_OTHER): Admitting: Physician Assistant

## 2023-10-17 VITALS — BP 103/63 | HR 51 | Temp 97.9°F | Resp 20 | Wt 147.5 lb

## 2023-10-17 DIAGNOSIS — D509 Iron deficiency anemia, unspecified: Secondary | ICD-10-CM | POA: Diagnosis not present

## 2023-10-17 DIAGNOSIS — K51918 Ulcerative colitis, unspecified with other complication: Secondary | ICD-10-CM | POA: Diagnosis present

## 2023-10-17 DIAGNOSIS — D638 Anemia in other chronic diseases classified elsewhere: Secondary | ICD-10-CM | POA: Diagnosis present

## 2023-10-17 LAB — CBC WITH DIFFERENTIAL (CANCER CENTER ONLY)
Abs Immature Granulocytes: 0.07 K/uL (ref 0.00–0.07)
Basophils Absolute: 0.1 K/uL (ref 0.0–0.1)
Basophils Relative: 1 %
Eosinophils Absolute: 0.1 K/uL (ref 0.0–0.5)
Eosinophils Relative: 1 %
HCT: 38.2 % (ref 36.0–46.0)
Hemoglobin: 13.6 g/dL (ref 12.0–15.0)
Immature Granulocytes: 1 %
Lymphocytes Relative: 22 %
Lymphs Abs: 2.5 K/uL (ref 0.7–4.0)
MCH: 26.7 pg (ref 26.0–34.0)
MCHC: 35.6 g/dL (ref 30.0–36.0)
MCV: 75 fL — ABNORMAL LOW (ref 80.0–100.0)
Monocytes Absolute: 0.8 K/uL (ref 0.1–1.0)
Monocytes Relative: 7 %
Neutro Abs: 7.7 K/uL (ref 1.7–7.7)
Neutrophils Relative %: 68 %
Platelet Count: 337 K/uL (ref 150–400)
RBC: 5.09 MIL/uL (ref 3.87–5.11)
RDW: 13.1 % (ref 11.5–15.5)
WBC Count: 11.2 K/uL — ABNORMAL HIGH (ref 4.0–10.5)
nRBC: 0 % (ref 0.0–0.2)

## 2023-10-17 LAB — FERRITIN: Ferritin: 45 ng/mL (ref 11–307)

## 2023-10-17 LAB — CMP (CANCER CENTER ONLY)
ALT: 9 U/L (ref 0–44)
AST: 13 U/L — ABNORMAL LOW (ref 15–41)
Albumin: 3.9 g/dL (ref 3.5–5.0)
Alkaline Phosphatase: 54 U/L (ref 38–126)
Anion gap: 3 — ABNORMAL LOW (ref 5–15)
BUN: 17 mg/dL (ref 6–20)
CO2: 27 mmol/L (ref 22–32)
Calcium: 8.6 mg/dL — ABNORMAL LOW (ref 8.9–10.3)
Chloride: 105 mmol/L (ref 98–111)
Creatinine: 0.84 mg/dL (ref 0.44–1.00)
GFR, Estimated: >60 mL/min (ref >60)
Glucose, Bld: 94 mg/dL (ref 70–99)
Potassium: 4.1 mmol/L (ref 3.5–5.1)
Sodium: 135 mmol/L (ref 135–145)
Total Bilirubin: 0.3 mg/dL (ref 0.0–1.2)
Total Protein: 7.5 g/dL (ref 6.5–8.1)

## 2023-10-17 LAB — IRON AND IRON BINDING CAPACITY (CC-WL,HP ONLY)
Iron: 47 ug/dL (ref 28–170)
Saturation Ratios: 13 % (ref 10.4–31.8)
TIBC: 375 ug/dL (ref 250–450)
UIBC: 328 ug/dL (ref 148–442)

## 2023-10-17 NOTE — Progress Notes (Signed)
 [facility] [phone]   [phone]  PROGRESS NOTE  Patient Care Team: Pcp, No as PCP - General  CHIEF COMPLAINTS/PURPOSE OF CONSULTATION:  Iron  deficiency anemia secondary to ulcerative colitis.   INTERVAL HISTORY:  [name] 38 y.o. female returns for a follow up for iron  deficiency anemia. She was last seen on 04/18/2023. In the interim, she denies any changes to her health. She is unaccompanied for this visit.   On exam today, [name] reports she is doing well without any fatigue or barriers to completing her ADLs.  She has a great appetite and has gained 10 pounds since March 2025.  She reports her ulcerative colitis is under control without any flareups.  She continues to have a monthly menstrual cycle lasting 3 days with 1 day of heavy bleeding.  She denies nausea, vomiting or bowel habit changes.  She denies fevers, chills, night sweats, shortness of breath, chest pain, cough, headaches, dizziness or ice cravings. She has no other complaints. Rest of the ROS is below.   MEDICAL HISTORY:  Past Medical History:  Diagnosis Date   Anemia    Hypokalemia 12/09/2019   Hyponatremia 12/11/2019   Pre-diabetes    Ulcerative colitis (HCC)     SURGICAL HISTORY: Past Surgical History:  Procedure Laterality Date   BIOPSY  12/11/2019   Procedure: BIOPSY;  Surgeon: Elicia Claw, MD;  Location: [facility];  Service: Gastroenterology;;   COLONOSCOPY N/A 12/11/2019   Procedure: COLONOSCOPY;  Surgeon: Elicia Claw, MD;  Location: [facility];  Service: Gastroenterology;  Laterality: N/A;   COLONOSCOPY     NO PAST SURGERIES      SOCIAL HISTORY: Social History   Socioeconomic History   Marital status: Single    Spouse name: Not on file   Number of children: 1   Years of education: Not on file   Highest education level: Not on file  Occupational History   Occupation: Server  Tobacco Use   Smoking status: Never   Smokeless  tobacco: Never  Vaping Use   Vaping status: Never Used  Substance and Sexual Activity   Alcohol use: Yes    Comment: occ   Drug use: Yes    Frequency: 7.0 times per week    Types: Marijuana   Sexual activity: Yes    Birth control/protection: Injection  Other Topics Concern   Not on file  Social History Narrative   Not on file   Social Drivers of Health   Financial Resource Strain: Not on file  Food Insecurity: Not on file  Transportation Needs: Not on file  Physical Activity: Not on file  Stress: Not on file  Social Connections: Not on file  Intimate Partner Violence: Not on file    FAMILY HISTORY: Family History  Problem Relation Age of Onset   Hypertension Father    Diabetes Father    Stomach cancer Maternal Aunt    Diabetes Maternal Grandmother    Heart disease Maternal Grandfather    Diabetes Paternal Grandfather    Colon cancer Neg Hx    Esophageal cancer Neg Hx    Pancreatic cancer Neg Hx     ALLERGIES:  is allergic to nsaids.  MEDICATIONS:  Current Outpatient Medications  Medication Sig Dispense Refill   RINVOQ  30 MG TB24 TAKE 1 TABLET (30 MG TOTAL)  BY MOUTH ONCE DAILY. MAINTENANCE DOSE. 30 tablet 3   amoxicillin -clavulanate (AUGMENTIN ) 875-125 MG tablet Take 1 tablet by mouth every 12 (twelve) hours. (Patient  not taking: Reported on 10/17/2023) 14 tablet 0   No current facility-administered medications for this visit.    REVIEW OF SYSTEMS:   Constitutional: ( - ) fevers, ( - )  chills , ( - ) night sweats Eyes: ( - ) blurriness of vision, ( - ) double vision, ( - ) watery eyes Ears, nose, mouth, throat, and face: ( - ) mucositis, ( - ) sore throat Respiratory: ( - ) cough, ( - ) dyspnea, ( - ) wheezes Cardiovascular: ( - ) palpitation, ( - ) chest discomfort, ( - ) lower extremity swelling Gastrointestinal:  ( - ) nausea, ( - ) heartburn, ( - ) change in bowel habits Skin: ( - ) abnormal skin rashes Lymphatics: ( - ) new lymphadenopathy, ( - ) easy  bruising Neurological: ( - ) numbness, ( - ) tingling, ( - ) new weaknesses Behavioral/Psych: ( - ) mood change, ( - ) new changes  All other systems were reviewed with the patient and are negative.  PHYSICAL EXAMINATION: ECOG PERFORMANCE STATUS: 1 - Symptomatic but completely ambulatory  Vitals:   10/17/23 0841  BP: 103/63  Pulse: (!) 51  Resp: 20  Temp: 97.9 F (36.6 C)  SpO2: 99%   Filed Weights   10/17/23 0841  Weight: 147 lb 8 oz (66.9 kg)    GENERAL: well appearing female in NAD  SKIN: skin color, texture, turgor are normal, no rashes or significant lesions EYES: conjunctiva are pink and non-injected, sclera clear LUNGS: clear to auscultation and percussion with normal breathing effort HEART: bradycardic with regular rhythm and no murmurs and no lower extremity edema Musculoskeletal: no cyanosis of digits and no clubbing  PSYCH: alert & oriented x 3, fluent speech NEURO: no focal motor/sensory deficits  LABORATORY DATA:  I have reviewed the data as listed    Latest Ref Rng & Units 10/17/2023    8:35 AM 06/02/2023    9:40 AM 04/18/2023    8:21 AM  CBC  WBC 4.0 - 10.5 K/uL 11.2  11.2  6.9   Hemoglobin 12.0 - 15.0 g/dL 86.3  86.8  86.9   Hematocrit 36.0 - 46.0 % 38.2  37.1  37.5   Platelets 150 - 400 K/uL 337  332  409        Latest Ref Rng & Units 06/02/2023    9:40 AM 08/23/2022    8:50 AM 07/14/2021   11:00 AM  CMP  Glucose 70 - 99 mg/dL 94   882   BUN 6 - 20 mg/dL 14   9   Creatinine 9.55 - 1.00 mg/dL 9.28   9.36   Sodium 864 - 145 mmol/L 137   136   Potassium 3.5 - 5.1 mmol/L 4.2   3.3   Chloride 98 - 111 mmol/L 105   103   CO2 22 - 32 mmol/L 24   29   Calcium 8.9 - 10.3 mg/dL 8.6   8.3   Total Protein 6.0 - 8.3 g/dL  7.7  7.3   Total Bilirubin 0.2 - 1.2 mg/dL  0.3  0.3   Alkaline Phos 39 - 117 U/L  51  67   AST 0 - 37 U/L  17  9   ALT 0 - 35 U/L  10  6     ASSESSMENT & PLAN [name] is a 38 y.o. female who presents for a follow up for iron   deficiency anemia.   #Iron  deficiency anemia 2/2 ulcerative colitis and  menorrhagia: --Last received IV venofer  200 mg x 3 doses from 08/29/2022-09/13/2022 --Patient does not take PO iron  therapy since iron  is unlikely absorbing much due to her UC.  --Labs from today shows no evidence of anemia with Hgb 13.6, MCV  75.0  Iron  panel is pending --Will arrange for IV iron  based on pending iron  panel.  --RTC in 3 months for lab check and 6  months for labs/follow up.  #Ulcerative colitis: --Under the care of [facility], last seen by Dr. San on 08/23/2022.  --Currently on Rinvoq  30 mg daily.   #Bradycardia: --Found to have HR in 50s during IV iron  infusion on 08/29/2022. . EKG performed and shows sinus rhythm with premature supraventricular complexes. --Evaluated by cardiology on 3/202/2025 with EKG that showed sinus rhythm without conduction disease symptoms. No further evaluation was recommended.  --HR is 51 today.    No orders of the defined types were placed in this encounter.   All questions were answered. The patient knows to call the clinic with any problems, questions or concerns.  I have spent a total of 25 minutes minutes of face-to-face and non-face-to-face time, preparing to see the patient, performing a medically appropriate examination, counseling and educating the patient, ordering medications/tests/procedures, documenting clinical information in the electronic health record, and care coordination.   Johnston Police, PA-C Department of Hematology/Oncology [facility] Phone: [phone]

## 2024-01-02 ENCOUNTER — Telehealth: Payer: Self-pay | Admitting: Gastroenterology

## 2024-01-03 NOTE — Telephone Encounter (Signed)
 Called and spoke with patient regarding Rinvoq  30 mg refill. Patient did confirm that she is still taking Rinvoq  30 mg daily. Patient is aware that she is overdue for a follow up appt. Patient will be scheduled for follow up with Dr. San on 01/15/24 at 10:20 am. Patient is aware that this is a 7-day hold appt and her appt will appear in MyChart on Monday once released. Patient verbalized understanding and had no concerns at the end of the call.

## 2024-01-08 ENCOUNTER — Other Ambulatory Visit: Payer: Self-pay

## 2024-01-08 ENCOUNTER — Ambulatory Visit: Payer: Self-pay

## 2024-01-08 ENCOUNTER — Inpatient Hospital Stay: Attending: Physician Assistant

## 2024-01-08 DIAGNOSIS — D638 Anemia in other chronic diseases classified elsewhere: Secondary | ICD-10-CM | POA: Diagnosis present

## 2024-01-08 DIAGNOSIS — D500 Iron deficiency anemia secondary to blood loss (chronic): Secondary | ICD-10-CM | POA: Insufficient documentation

## 2024-01-08 DIAGNOSIS — D509 Iron deficiency anemia, unspecified: Secondary | ICD-10-CM

## 2024-01-08 DIAGNOSIS — K51918 Ulcerative colitis, unspecified with other complication: Secondary | ICD-10-CM | POA: Insufficient documentation

## 2024-01-08 LAB — FERRITIN: Ferritin: 67 ng/mL (ref 11–307)

## 2024-01-08 LAB — CBC WITH DIFFERENTIAL (CANCER CENTER ONLY)
Abs Immature Granulocytes: 0.03 K/uL (ref 0.00–0.07)
Basophils Absolute: 0 K/uL (ref 0.0–0.1)
Basophils Relative: 1 %
Eosinophils Absolute: 0.1 K/uL (ref 0.0–0.5)
Eosinophils Relative: 1 %
HCT: 36.2 % (ref 36.0–46.0)
Hemoglobin: 13.1 g/dL (ref 12.0–15.0)
Immature Granulocytes: 1 %
Lymphocytes Relative: 45 %
Lymphs Abs: 3 K/uL (ref 0.7–4.0)
MCH: 27 pg (ref 26.0–34.0)
MCHC: 36.2 g/dL — ABNORMAL HIGH (ref 30.0–36.0)
MCV: 74.6 fL — ABNORMAL LOW (ref 80.0–100.0)
Monocytes Absolute: 0.6 K/uL (ref 0.1–1.0)
Monocytes Relative: 8 %
Neutro Abs: 3 K/uL (ref 1.7–7.7)
Neutrophils Relative %: 44 %
Platelet Count: 336 K/uL (ref 150–400)
RBC: 4.85 MIL/uL (ref 3.87–5.11)
RDW: 13 % (ref 11.5–15.5)
WBC Count: 6.6 K/uL (ref 4.0–10.5)
nRBC: 0 % (ref 0.0–0.2)

## 2024-01-08 LAB — CMP (CANCER CENTER ONLY)
ALT: 12 U/L (ref 0–44)
AST: 22 U/L (ref 15–41)
Albumin: 4.1 g/dL (ref 3.5–5.0)
Alkaline Phosphatase: 56 U/L (ref 38–126)
Anion gap: 7 (ref 5–15)
BUN: 14 mg/dL (ref 6–20)
CO2: 24 mmol/L (ref 22–32)
Calcium: 8.4 mg/dL — ABNORMAL LOW (ref 8.9–10.3)
Chloride: 106 mmol/L (ref 98–111)
Creatinine: 0.75 mg/dL (ref 0.44–1.00)
GFR, Estimated: >60 mL/min (ref >60)
Glucose, Bld: 90 mg/dL (ref 70–99)
Potassium: 4 mmol/L (ref 3.5–5.1)
Sodium: 137 mmol/L (ref 135–145)
Total Bilirubin: 0.5 mg/dL (ref 0.0–1.2)
Total Protein: 7.7 g/dL (ref 6.5–8.1)

## 2024-01-08 LAB — IRON AND IRON BINDING CAPACITY (CC-WL,HP ONLY)
Iron: 144 ug/dL (ref 28–170)
Saturation Ratios: 43 % — ABNORMAL HIGH (ref 10.4–31.8)
TIBC: 337 ug/dL (ref 250–450)
UIBC: 193 ug/dL

## 2024-01-08 NOTE — Telephone Encounter (Signed)
 Follow up appt has been scheduled for Monday, 01/15/24 at 10:20 am (Dr. San).

## 2024-01-15 ENCOUNTER — Ambulatory Visit: Admitting: Gastroenterology

## 2024-01-15 ENCOUNTER — Ambulatory Visit: Payer: Self-pay | Admitting: Gastroenterology

## 2024-01-15 ENCOUNTER — Other Ambulatory Visit (INDEPENDENT_AMBULATORY_CARE_PROVIDER_SITE_OTHER)

## 2024-01-15 ENCOUNTER — Encounter: Payer: Self-pay | Admitting: Gastroenterology

## 2024-01-15 VITALS — BP 110/70 | HR 61 | Ht 59.0 in | Wt 149.4 lb

## 2024-01-15 DIAGNOSIS — R110 Nausea: Secondary | ICD-10-CM | POA: Diagnosis not present

## 2024-01-15 DIAGNOSIS — K602 Anal fissure, unspecified: Secondary | ICD-10-CM

## 2024-01-15 DIAGNOSIS — K5100 Ulcerative (chronic) pancolitis without complications: Secondary | ICD-10-CM | POA: Diagnosis not present

## 2024-01-15 DIAGNOSIS — D509 Iron deficiency anemia, unspecified: Secondary | ICD-10-CM

## 2024-01-15 LAB — C-REACTIVE PROTEIN: CRP: <0.5 mg/dL (ref 0.5–20.0)

## 2024-01-15 LAB — LIPID PANEL
Cholesterol: 187 mg/dL (ref 0–200)
HDL: 65.2 mg/dL (ref >39.00)
LDL Cholesterol: 112 mg/dL — ABNORMAL HIGH (ref 0–99)
NonHDL: 121.53
Total CHOL/HDL Ratio: 3
Triglycerides: 48 mg/dL (ref 0.0–149.0)
VLDL: 9.6 mg/dL (ref 0.0–40.0)

## 2024-01-15 LAB — SEDIMENTATION RATE: Sed Rate: 7 mm/h (ref 0–20)

## 2024-01-15 MED ORDER — AMBULATORY NON FORMULARY MEDICATION: 0 refills | Status: AC

## 2024-01-15 MED ORDER — ONDANSETRON HCL 4 MG PO TABS: 4.0000 mg | ORAL_TABLET | Freq: Four times a day (QID) | ORAL | 3 refills | Status: AC | PRN

## 2024-01-15 MED ORDER — NA SULFATE-K SULFATE-MG SULF 17.5-3.13-1.6 GM/177ML PO SOLN: 1.0000 | ORAL | 0 refills | Status: AC

## 2024-01-15 NOTE — Patient Instructions (Addendum)
 _______________________________________________________  If your blood pressure at your visit was 140/90 or greater, please contact your primary care physician to follow up on this.  _______________________________________________________  If you are age 36 or older, your body mass index should be between 23-30. Your Body mass index is 30.17 kg/m. If this is out of the aforementioned range listed, please consider follow up with your Primary Care Provider.  If you are age 29 or younger, your body mass index should be between 19-25. Your Body mass index is 30.17 kg/m. If this is out of the aformentioned range listed, please consider follow up with your Primary Care Provider.   ________________________________________________________  The [facility] providers would like to encourage you to use MYCHART to communicate with providers for non-urgent requests or questions.  Due to long hold times on the telephone, sending your provider a message by [facility] may be a faster and more efficient way to get a response.  Please allow 48 business hours for a response.  Please remember that this is for non-urgent requests.  _______________________________________________________  [facility] is using a team-based approach to care.  Your team is made up of your doctor and two to three APPS. Our APPS (Nurse Practitioners and Physician Assistants) work with your physician to ensure care continuity for you. They are fully qualified to address your health concerns and develop a treatment plan. They communicate directly with your gastroenterologist to care for you. Seeing the Advanced Practice Practitioners on your physician's team can help you by facilitating care more promptly, often allowing for earlier appointments, access to diagnostic testing, procedures, and other specialty referrals.   Your provider has requested that you go to the basement level for lab work before leaving today. Press B on the  elevator. The lab is located at the first door on the left as you exit the elevator.  You have been scheduled for a colonoscopy. Please follow written instructions given to you at your visit today.   If you use inhalers (even only as needed), please bring them with you on the day of your procedure.  DO NOT TAKE 7 DAYS PRIOR TO TEST- Trulicity (dulaglutide) Ozempic, Wegovy (semaglutide) Mounjaro, Zepbound (tirzepatide) Bydureon Bcise (exanatide extended release)  DO NOT TAKE 1 DAY PRIOR TO YOUR TEST Rybelsus (semaglutide) Adlyxin (lixisenatide) Victoza (liraglutide) Byetta (exanatide) ___________________________________________________________________________  We have sent the following medications to your pharmacy for you to pick up at your convenience:  START: Zofran  4mg  one tablet every 6 hours as needed for nasuea  We have sent a prescription for nitroglycerin 0.125% gel to [facility]. You should apply a pea size amount to your rectum two times daily x 6 weeks.  [facility]'s information is below: Address: [address] [place], [place] [address]  [phone]  *Please DO NOT go directly from our office to pick up this medication! Give the pharmacy 1 day to process the prescription as this is compounded and takes time to make.  We have referred you to Dermatology.  Someone should contact you with an appointment.  If you have not heard from their office in 1 to 2 weeks, please let our office know.   How to Take a Sitz Bath A sitz bath is a warm water bath that may be used to care for your rectum, genital area, or the area between your rectum and genitals (perineum). In a sitz bath, the water only comes up to your hips and covers your buttocks. A sitz bath may  be done in a bathtub or with a portable sitz bath that fits over the toilet. Your health care provider may recommend a sitz bath to help: Relieve pain and discomfort after delivering a  baby. Relieve pain and itching from hemorrhoids or anal fissures. Relieve pain after certain surgeries. Relax muscles that are sore or tight. How to take a sitz bath Take 2-4 sitz baths a day, or as many as told by your health care provider. Bathtub sitz bath To take a sitz bath in a bathtub: Partially fill a bathtub with warm water. The water should be deep enough to cover your hips and buttocks when you are sitting in the bathtub. Follow your health care provider's instructions if you are told to put medicine in the water. Sit in the water. Open the bathtub drain a little, and leave it open during your bath. Turn on the warm water again, enough to replace the water that is draining out. Keep the water running throughout your bath. This helps keep the water at the right level and temperature. Soak in the water for 15-20 minutes, or as long as told by your health care provider. When you are done, be careful when you stand up. You may feel dizzy. After the sitz bath, pat yourself dry. Do not rub your skin to dry it.  Over-the-toilet sitz bath To take a sitz bath with an over-the-toilet basin: Follow the manufacturer's instructions. Fill the basin with warm water. Follow your health care provider's instructions if you were told to put medicine in the water. Sit on the seat. Make sure the water covers your buttocks and perineum. Soak in the water for 15-20 minutes, or as long as told by your health care provider. After the sitz bath, pat yourself dry. Do not rub your skin to dry it. Clean and dry the basin between uses. Discard the basin if it cracks, or according to the manufacturer's instructions.  Contact a health care provider if: Your pain or itching gets worse. Stop doing sitz baths if your symptoms get worse. You have new symptoms. Stop doing sitz baths until you talk with your health care provider. Summary A sitz bath is a warm water bath in which the water only comes up to your  hips and covers your buttocks. Your health care provider may recommend a sitz bath to help relieve pain and discomfort after delivering a baby, relieve pain and itching from hemorrhoids or anal fissures, relieve pain after certain surgeries, or help to relax muscles that are sore or tight. Take 2-4 sitz baths a day, or as many as told by your health care provider. Soak in the water for 15-20 minutes. Stop doing sitz baths if your symptoms get worse. This information is not intended to replace advice given to you by your health care provider. Make sure you discuss any questions you have with your health care provider. Document Revised: 04/20/2021 Document Reviewed: 04/20/2021 Elsevier Patient Education  2024 [employer].  Due to recent changes in healthcare laws, you may see the results of your imaging and laboratory studies on MyChart before your provider has had a chance to review them.  We understand that in some cases there may be results that are confusing or concerning to you. Not all laboratory results come back in the same time frame and the provider may be waiting for multiple results in order to interpret others.  Please give us  48 hours in order for your provider to thoroughly review all the  results before contacting the office for clarification of your results.    It was a pleasure to see you today!  Vito Cirigliano, D.O.

## 2024-01-15 NOTE — Progress Notes (Signed)
 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  Was treated with prednisone  and she reports having clinical improvement during the 4 weeks that she took this medication.   - 12/2019:  normal/negative QuantiFERON gold.  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 HBsAg-. - She never followed up with Dr. Elicia at [facility], and transitioned to herbal remedies. - 12/2020: Hospital admission with UC flare.  Albumin 1.4, H/H 10/30, PLT 615.  Treated with prednisone  with plan to transition to biologic therapy as outpatient (did not want to start immunomodulator due to intermediate TPMT level).  CRP 4.8, ESR 64, GI PCR panel negative - 01/2021: Follow-up with [facility].  Had planned to initiate Remicade , but her insurance changed and can no longer follow with [facility] - 03/04/2021: CT A/P: Marked diffuse colorectal wall thickening and mucosal hyperenhancement with loss of haustral folds and slight haziness of pericolonic fat worse compared with 12/2019 c/w UC pancolitis - 07/14/2021: Established outpatient care with [facility].  Was having breakthrough symptoms at prednisone  20 mg/day with up to 10 BM/day, rectal bleeding, diarrhea.  QuantiFERON gold negative.  ESR 82, CRP 2.4, H/H 7.5/24 with MCV/RDW 56/19, ferritin 12.7, iron  13, TIBC 344, sat 3.8%.  B12 364, folate 7.8.  Was referred for IV iron , initiating approval for Remicade  - 08/04/2021: Evaluation in the Hematology clinic.  Started IV Venofer  - 09/08/2021: GI follow-up.  Reported clinical improvement since initiation of Remicade  with 1 BM/day, occasional BRBPR but improved.  Was still on prednisone  20 mg/day; started weaning. - 10/22/2021: GI follow-up.  Reports having flare of symptoms starting in mid August despite Remicade  infusions with increased urgency, nocturnal stools.  Concern for primary  nonresponder, but elected to continue infliximab  for the time being with repeat steroid course - 03/11/2022: GI follow-up appointment.  Usually symptoms improved after starting prednisone , but recurrence of symptoms when prednisone  tapers below 20 mg/day. Labs confirmed presence of anti-infliximab  antibody (74) and low drug l trough evel (0.8) - 05/13/2022: GI follow-up appointment.  Started Rinvoq . - 08/23/2022: Follow-up in the GI clinic with good response to Rinvoq .  Normal inflammatory markers     Endoscopic History: - Colonoscopy (12/2019, Dr. Elicia): Severe Mayo 3 inflammation from rectum to cecum.  Normal TI  HPI:     Patient is a 38 y.o. female presenting to the Gastroenterology Clinic for follow-up.  Was last seen by me on 7/23//24.  Was doing well with Rinvoq .  Reviewed labs from last week: - MCV 74 and stable from previous.  Otherwise normal CBC - Normal CMP, ferritin, iron  panel  Was last seen in the Hematology Clinic on 10/17/2023.   Separately, did have recent external anal fissure.  Characterized as scant BRB on tissue paper with associated dyschezia and TTP.  Tried topical A&D ointment, Desitin cream.  No overt bleeding and no associated changes in bowel habits.  Separately, will have nausea at times. Worse during menses. Has used Zofran  in the past with good relief and requesting refill.   Otherwise, feeling well and Rinvoq  seems to be working very well for her.  Review of systems:     No chest pain, no SOB, no fevers, no urinary sx   Past Medical History:  Diagnosis Date   Anemia    Hypokalemia 12/09/2019   Hyponatremia 12/11/2019   Pre-diabetes    Ulcerative colitis (HCC)  Patient's surgical history, family medical history, social history, medications and allergies were all reviewed in Epic    Current Outpatient Medications  Medication Sig Dispense Refill   RINVOQ  30 MG TB24 Take 1 tablet by mouth once daily 30 tablet 0   No current  facility-administered medications for this visit.    Physical Exam:     BP 110/70   Pulse 61   Ht 4' 11 (1.499 m)   Wt 149 lb 6 oz (67.8 kg)   BMI 30.17 kg/m   GENERAL:  Pleasant female in NAD PSYCH: : Cooperative, normal affect Musculoskeletal:  Normal muscle tone, normal strength NEURO: Alert and oriented x 3, no focal neurologic deficits   IMPRESSION and PLAN:    1) Ulcerative Colitis (Pancolitis): 38 year old female with a history of steroid responsive pan ulcerative colitis diagnosed 12/2019, eventually started on infliximab  in 08/2021.  Never fully responded to infliximab  (primary nonresponder), and labs confirmed presence of anti-infliximab  antibody (74) and low drug level (0.8), so was changed to Rinvoq  in 05/2022 with good clinical response.  Has since transitioned to 30 mg daily dosing for maintenance and seems to be in clinical remission.   - Continue Rinvoq  30 mg daily - Check ESR, CRP, fecal calprotectin for proactive monitoring - Colonoscopy to evaluate for deep remission and response to therapy - Recommended Shingrix vaccine, but she declined - Declined flu vaccine - Placed referral to Dermatology for routine skin check and acne (could be related to Rinvoq ) - Repeat CBC check last week with normal H/H, normal WBC, normal CMP - Annual lipid panel - Avoid live vaccines - MVI daily     2) Iron  deficiency anemia - Follows in the Hematology Clinic.  Not tolerant to oral iron . Good response to IV iron  with normalization of hemoglobin and iron  panel. - Continue follow-up in the Hematology clinic as scheduled'  3) Anal Fissure: - Start topical NTG 0.125%, apply a small, pea-sized amount to the affected area BID for 6-8 weeks.  - We discussed the ADR of headache, and if patient does experience, to call me and will change and make pharmacy request to compound topical CCB (topical nifedipine 0.2-0.3% applied 2-4 times daily; unfortunately, the CCB also carries an ADR of  headache in 5-12%). Additionally, cautioned to avoid strenuous activity within 30 minutes of application  - Start or continue fiber supplement for a goal of regular, soft, stools without straining to have a bowel movement. If unable to achieve with fiber alone, or if sxs are exacerbated with fiber, can consider starting oral laxative agent (ie, Miralax ) to improve stool consistency and aid in fissure healing  - Sitz bath with warm water for 10-15 minutes 2-3 times daily; directed to [employer] available online or at [employer]; ensure to dry area afterwards  4) Nausea without vomiting - Zofran  4 mg PO prn Q6 hours as needed for nausea    RTC in 6-12 months or sooner prn  The indications, risks, and benefits of colonoscopy were explained to the patient in detail. Risks include but are not limited to bleeding, perforation, adverse reaction to medications, and cardiopulmonary compromise. Sequelae include but are not limited to the possibility of surgery, hospitalization, and mortality. The patient verbalized understanding and wished to proceed. All questions answered, referred to the scheduler and bowel prep ordered. Further recommendations pending results of the exam.            Sandor GAILS Indio Santilli ,DO, FACG 01/15/2024, 10:26 AM

## 2024-01-23 ENCOUNTER — Telehealth: Payer: Self-pay | Admitting: Gastroenterology

## 2024-01-23 NOTE — Telephone Encounter (Signed)
 Inbound call from patient stating that her pharmacy did not receive a prescription for Nitroglycerin Gel and needed to  push the prescription for  Rinvoq . Please advise.

## 2024-01-23 NOTE — Telephone Encounter (Signed)
 Patient aware that Nitroglycerin was sent to [facility] and not her regular pharmacy.  She was advised to contact [facility] with number provided to check the status of Nitroglycerin gel.  Elspeth and Nyla aware that Rinvog should be refilled.

## 2024-02-14 ENCOUNTER — Encounter: Admitting: Gastroenterology
